# Patient Record
Sex: Female | Born: 1950 | Race: White | Hispanic: No | Marital: Single | Smoking: Current every day smoker
Health system: Southern US, Community
[De-identification: ages and names within clinical notes are randomized; demographics above are authoritative.]

## PROBLEM LIST (undated history)

## (undated) DIAGNOSIS — E785 Hyperlipidemia, unspecified: Secondary | ICD-10-CM

## (undated) DIAGNOSIS — T7840XA Allergy, unspecified, initial encounter: Secondary | ICD-10-CM

## (undated) DIAGNOSIS — Z5189 Encounter for other specified aftercare: Secondary | ICD-10-CM

## (undated) HISTORY — DX: Allergy, unspecified, initial encounter: T78.40XA

## (undated) HISTORY — PX: DENTAL SURGERY: SHX609

## (undated) HISTORY — DX: Encounter for other specified aftercare: Z51.89

## (undated) HISTORY — DX: Hyperlipidemia, unspecified: E78.5

---

## 1974-04-23 DIAGNOSIS — Z5189 Encounter for other specified aftercare: Secondary | ICD-10-CM

## 1974-04-23 HISTORY — DX: Encounter for other specified aftercare: Z51.89

## 1981-04-23 HISTORY — PX: TUBAL LIGATION: SHX77

## 2002-03-14 ENCOUNTER — Emergency Department (HOSPITAL_COMMUNITY): Admission: EM | Admit: 2002-03-14 | Discharge: 2002-03-15 | Payer: Self-pay | Admitting: Emergency Medicine

## 2013-06-29 ENCOUNTER — Ambulatory Visit: Payer: Self-pay | Admitting: Family Medicine

## 2013-10-13 ENCOUNTER — Encounter: Payer: Self-pay | Admitting: Internal Medicine

## 2013-11-26 ENCOUNTER — Encounter: Payer: Self-pay | Admitting: Obstetrics & Gynecology

## 2013-11-26 ENCOUNTER — Ambulatory Visit (INDEPENDENT_AMBULATORY_CARE_PROVIDER_SITE_OTHER): Payer: 59 | Admitting: Obstetrics & Gynecology

## 2013-11-26 VITALS — BP 157/75 | HR 82 | Temp 97.3°F | Ht 67.0 in | Wt 150.0 lb

## 2013-11-26 DIAGNOSIS — Z0000 Encounter for general adult medical examination without abnormal findings: Secondary | ICD-10-CM

## 2013-11-26 DIAGNOSIS — Z01419 Encounter for gynecological examination (general) (routine) without abnormal findings: Secondary | ICD-10-CM

## 2013-11-26 NOTE — Progress Notes (Signed)
Subjective:     Cynthia Gonzalez is a 63 y.o. female here for a routine exam.  Current complaints: none.    Personal health questionnaire:  Is patient Ashkenazi Jewish, have a family history of breast and/or ovarian cancer: no Is there a family history of uterine cancer diagnosed at age < 24, gastrointestinal cancer, urinary tract cancer, family member who is a Field seismologist syndrome-associated carrier: no Is the patient overweight and hypertensive, family history of diabetes, personal history of gestational diabetes or PCOS: no Is patient over 33, have PCOS,  family history of premature CHD under age 36, diabetes, smoke, have hypertension or peripheral artery disease:  yes At any time, has a partner hit, kicked or otherwise hurt or frightened you?: no Over the past 2 weeks, have you felt down, depressed or hopeless?: no Over the past 2 weeks, have you felt little interest or pleasure in doing things?:no   Gynecologic History No LMP recorded. Patient is postmenopausal. Contraception: post menopausal status Last Pap: 20 yrs ago. Results were: normal Last mammogram: 20 yrs ago. Results were: normal  Obstetric History OB History  Gravida Para Term Preterm AB SAB TAB Ectopic Multiple Living  6 6 6      1 7     # Outcome Date GA Lbr Len/2nd Weight Sex Delivery Anes PTL Lv  6A TRM 06/23/81 [redacted]w[redacted]d   F SVD   Y  6B  06/23/81 [redacted]w[redacted]d   M SVD   Y  5 TRM 11/30/79 [redacted]w[redacted]d   F SVD   Y  4 TRM 06/24/77 [redacted]w[redacted]d   F SVD   Y  3 TRM 10/21/74 [redacted]w[redacted]d   M SVD   Y  2 TRM 07/07/71 [redacted]w[redacted]d   F SVD   Y  1 TRM 06/04/66 [redacted]w[redacted]d   F SVD None  Y      History reviewed. No pertinent past medical history.  History reviewed. No pertinent past surgical history.  No current outpatient prescriptions on file. No Known Allergies  History  Substance Use Topics  . Smoking status: Current Every Day Smoker -- 0.50 packs/day for 40 years    Types: Cigarettes  . Smokeless tobacco: Never Used  . Alcohol Use: No    Family History   Problem Relation Age of Onset  . Cirrhosis Mother   . Cancer Father       Review of Systems  Constitutional: negative for fatigue and weight loss Respiratory: negative for cough and wheezing Cardiovascular: negative for chest pain, fatigue and palpitations Gastrointestinal: negative for abdominal pain and change in bowel habits Musculoskeletal:negative for myalgias Neurological: negative for gait problems and tremors Behavioral/Psych: negative for abusive relationship, depression Endocrine: negative for temperature intolerance   Genitourinary:negative for abnormal menstrual periods, genital lesions, hot flashes, sexual problems and vaginal discharge Integument/breast: negative for breast lump, breast tenderness, nipple discharge and skin lesion(s)    Objective:       BP 157/75  Pulse 82  Temp(Src) 97.3 F (36.3 C) (Oral)  Ht 5\' 7"  (1.702 m)  Wt 68.04 kg (150 lb)  BMI 23.49 kg/m2 General:   alert  Skin:   no rash or abnormalities  Lungs:   clear to auscultation bilaterally  Heart:   regular rate and rhythm, S1, S2 normal, no murmur, click, rub or gallop  Breasts:   normal without suspicious masses, skin or nipple changes or axillary nodes  Abdomen:  normal findings: no organomegaly, soft, non-tender and no hernia  Pelvis:  External genitalia: normal general appearance Urinary  system: urethral meatus normal and bladder without fullness, non tender; second degree cystocele with Valsalva Vaginal: normal without tenderness, induration or masses, rectocele Cervix: normal appearance Adnexa: normal bimanual exam Uterus: anteverted and non-tender, normal size   Lab Review  Labs reviewed no Radiologic studies reviewed no    Assessment:    Healthy female exam.  Lapse in routine gynecologic preventive care POP--asymptomatic   Plan:    Education reviewed: calcium supplements, low fat, low cholesterol diet, smoking cessation, weight bearing exercise and counseled re:  Kegel's exercises. Mammogram ordered. BMD ordered    Orders Placed This Encounter  Procedures  . MM DIGITAL SCREENING BILATERAL    Standing Status: Future     Number of Occurrences:      Standing Expiration Date: 01/27/2015    Order Specific Question:  Reason for Exam (SYMPTOM  OR DIAGNOSIS REQUIRED)    Answer:  routine screening    Order Specific Question:  Preferred imaging location?    Answer:  [facility]  . DG Bone Density    Standing Status: Future     Number of Occurrences:      Standing Expiration Date: 01/27/2015    Order Specific Question:  Reason for Exam (SYMPTOM  OR DIAGNOSIS REQUIRED)    Answer:  routine screening , menopausal female    Order Specific Question:  Preferred imaging location?    Answer:  [facility]    Follow up as needed.

## 2013-11-26 NOTE — Patient Instructions (Signed)
Kegel Exercises The goal of Kegel exercises is to isolate and exercise your pelvic floor muscles. These muscles act as a hammock that supports the rectum, vagina, small intestine, and uterus. As the muscles weaken, the hammock sags and these organs are displaced from their normal positions. Kegel exercises can strengthen your pelvic floor muscles and help you to improve bladder and bowel control, improve sexual response, and help reduce many problems and some discomfort during pregnancy. Kegel exercises can be done anywhere and at any time. HOW TO PERFORM KEGEL EXERCISES 1. Locate your pelvic floor muscles. To do this, squeeze (contract) the muscles that you use when you try to stop the flow of urine. You will feel a tightness in the vaginal area (women) and a tight lift in the rectal area (men and women). 2. When you begin, contract your pelvic muscles tight for 2-5 seconds, then relax them for 2-5 seconds. This is one set. Do 4-5 sets with a short pause in between. 3. Contract your pelvic muscles for 8-10 seconds, then relax them for 8-10 seconds. Do 4-5 sets. If you cannot contract your pelvic muscles for 8-10 seconds, try 5-7 seconds and work your way up to 8-10 seconds. Your goal is 4-5 sets of 10 contractions each day. Keep your stomach, buttocks, and legs relaxed during the exercises. Perform sets of both short and long contractions. Vary your positions. Perform these contractions 3-4 times per day. Perform sets while you are:   Lying in bed in the morning.  Standing at lunch.  Sitting in the late afternoon.  Lying in bed at night. You should do 40-50 contractions per day. Do not perform more Kegel exercises per day than recommended. Overexercising can cause muscle fatigue. Continue these exercises for for at least 15-20 weeks or as directed by your caregiver. Document Released: 03/26/2012 Document Reviewed: 03/26/2012 [facility] Patient Information 2015 [place]. This information is  not intended to replace advice given to you by your health care provider. Make sure you discuss any questions you have with your health care provider. Kegel Exercises The goal of Kegel exercises is to isolate and exercise your pelvic floor muscles. These muscles act as a hammock that supports the rectum, vagina, small intestine, and uterus. As the muscles weaken, the hammock sags and these organs are displaced from their normal positions. Kegel exercises can strengthen your pelvic floor muscles and help you to improve bladder and bowel control, improve sexual response, and help reduce many problems and some discomfort during pregnancy. Kegel exercises can be done anywhere and at any time. HOW TO PERFORM KEGEL EXERCISES 4. Locate your pelvic floor muscles. To do this, squeeze (contract) the muscles that you use when you try to stop the flow of urine. You will feel a tightness in the vaginal area (women) and a tight lift in the rectal area (men and women). 5. When you begin, contract your pelvic muscles tight for 2-5 seconds, then relax them for 2-5 seconds. This is one set. Do 4-5 sets with a short pause in between. 6. Contract your pelvic muscles for 8-10 seconds, then relax them for 8-10 seconds. Do 4-5 sets. If you cannot contract your pelvic muscles for 8-10 seconds, try 5-7 seconds and work your way up to 8-10 seconds. Your goal is 4-5 sets of 10 contractions each day. Keep your stomach, buttocks, and legs relaxed during the exercises. Perform sets of both short and long contractions. Vary your positions. Perform these contractions 3-4 times per day. Perform sets  while you are:   Lying in bed in the morning.  Standing at lunch.  Sitting in the late afternoon.  Lying in bed at night. You should do 40-50 contractions per day. Do not perform more Kegel exercises per day than recommended. Overexercising can cause muscle fatigue. Continue these exercises for for at least 15-20 weeks or as directed  by your caregiver. Document Released: 03/26/2012 Document Reviewed: 03/26/2012 [facility] Patient Information 2015 [place]. This information is not intended to replace advice given to you by your health care provider. Make sure you discuss any questions you have with your health care provider. Kegel Exercises The goal of Kegel exercises is to isolate and exercise your pelvic floor muscles. These muscles act as a hammock that supports the rectum, vagina, small intestine, and uterus. As the muscles weaken, the hammock sags and these organs are displaced from their normal positions. Kegel exercises can strengthen your pelvic floor muscles and help you to improve bladder and bowel control, improve sexual response, and help reduce many problems and some discomfort during pregnancy. Kegel exercises can be done anywhere and at any time. HOW TO PERFORM KEGEL EXERCISES 7. Locate your pelvic floor muscles. To do this, squeeze (contract) the muscles that you use when you try to stop the flow of urine. You will feel a tightness in the vaginal area (women) and a tight lift in the rectal area (men and women). 8. When you begin, contract your pelvic muscles tight for 2-5 seconds, then relax them for 2-5 seconds. This is one set. Do 4-5 sets with a short pause in between. 9. Contract your pelvic muscles for 8-10 seconds, then relax them for 8-10 seconds. Do 4-5 sets. If you cannot contract your pelvic muscles for 8-10 seconds, try 5-7 seconds and work your way up to 8-10 seconds. Your goal is 4-5 sets of 10 contractions each day. Keep your stomach, buttocks, and legs relaxed during the exercises. Perform sets of both short and long contractions. Vary your positions. Perform these contractions 3-4 times per day. Perform sets while you are:   Lying in bed in the morning.  Standing at lunch.  Sitting in the late afternoon.  Lying in bed at night. You should do 40-50 contractions per day. Do not perform more  Kegel exercises per day than recommended. Overexercising can cause muscle fatigue. Continue these exercises for for at least 15-20 weeks or as directed by your caregiver. Document Released: 03/26/2012 Document Reviewed: 03/26/2012 [facility] Patient Information 2015 [place]. This information is not intended to replace advice given to you by your health care provider. Make sure you discuss any questions you have with your health care provider. Bone Health Our bones do many things. They provide structure, protect organs, anchor muscles, and store calcium. Adequate calcium in your diet and weight-bearing physical activity help build strong bones, improve bone amounts, and may reduce the risk of weakening of bones (osteoporosis) later in life. PEAK BONE MASS By age 64, the average woman has acquired most of her skeletal bone mass. A large decline occurs in older adults which increases the risk of osteoporosis. In women this occurs around the time of menopause. It is important for young girls to reach their peak bone mass in order to maintain bone health throughout life. A person with high bone mass as a young adult will be more likely to have a higher bone mass later in life. Not enough calcium consumption and physical activity early on could result in a  failure to achieve optimum bone mass in adulthood. OSTEOPOROSIS Osteoporosis is a disease of the bones. It is defined as low bone mass with deterioration of bone structure. Osteoporosis leads to an increase risk of fractures with falls. These fractures commonly happen in the wrist, hip, and spine. While men and women of all ages and background can develop osteoporosis, some of the risk factors for osteoporosis are:  Female.  White.  Postmenopausal.  Older adults.  Small in body size.  Eating a diet low in calcium.  Physically inactive.  Smoking.  Use of some medications.  Family history. CALCIUM Calcium is a mineral needed by the  body for healthy bones, teeth, and proper function of the heart, muscles, and nerves. The body cannot produce calcium so it must be absorbed through food. Good sources of calcium include:  Dairy products (low fat or nonfat milk, cheese, and yogurt).  Dark green leafy vegetables (bok choy and broccoli).  Calcium fortified foods (orange juice, cereal, bread, soy beverages, and tofu products).  Nuts (almonds). Recommended amounts of calcium vary for individuals. RECOMMENDED CALCIUM INTAKES Age and Amount in mg per day  Children 1 to 3 years / 700 mg  Children 4 to 8 years / 1,000 mg  Children 9 to 13 years / 1,300 mg  Teens 14 to 18 years / 1,300 mg  Adults 19 to 50 years / 1,000 mg  Adult women 51 to 70 years / 1,200 mg  Adults 71 years and older / 1,200 mg  Pregnant and breastfeeding teens / 1,300 mg  Pregnant and breastfeeding adults / 1,000 mg Vitamin D also plays an important role in healthy bone development. Vitamin D helps in the absorption of calcium. WEIGHT-BEARING PHYSICAL ACTIVITY Regular physical activity has many positive health benefits. Benefits include strong bones. Weight-bearing physical activity early in life is important in reaching peak bone mass. Weight-bearing physical activities cause muscles and bones to work against gravity. Some examples of weight bearing physical activities include:  Walking, jogging, or running.  [employer].  Jumping rope.  Dancing.  Soccer.  Tennis or Racquetball.  Stair climbing.  Basketball.  Hiking.  Weight lifting.  Aerobic fitness classes. Including weight-bearing physical activity into an exercise plan is a great way to keep bones healthy. Adults: Engage in at least 30 minutes of moderate physical activity on most, preferably all, days of the week. Children: Engage in at least 60 minutes of moderate physical activity on most, preferably all, days of the week. FOR MORE INFORMATION [place] Higher education careers adviser, Soil scientist for [employer] and Promotion: www.cnpp.usda.[place]: EquipmentWeekly.com.ee Document Released: 06/30/2003 Document Revised: 08/04/2012 Document Reviewed: 09/29/2008 [facility] Patient Information 2015 [place], [place]. This information is not intended to replace advice given to you by your health care provider. Make sure you discuss any questions you have with your health care provider.

## 2013-11-27 LAB — PAP IG AND HPV HIGH-RISK: HPV DNA HIGH RISK: NOT DETECTED

## 2013-11-28 ENCOUNTER — Encounter: Payer: Self-pay | Admitting: Obstetrics & Gynecology

## 2013-11-28 DIAGNOSIS — R87610 Atypical squamous cells of undetermined significance on cytologic smear of cervix (ASC-US): Secondary | ICD-10-CM | POA: Insufficient documentation

## 2013-11-30 ENCOUNTER — Other Ambulatory Visit: Payer: Self-pay | Admitting: Obstetrics & Gynecology

## 2013-11-30 DIAGNOSIS — E2839 Other primary ovarian failure: Secondary | ICD-10-CM

## 2013-11-30 DIAGNOSIS — Z780 Asymptomatic menopausal state: Secondary | ICD-10-CM

## 2013-11-30 DIAGNOSIS — Z1231 Encounter for screening mammogram for malignant neoplasm of breast: Secondary | ICD-10-CM

## 2013-12-22 ENCOUNTER — Encounter: Payer: Self-pay | Admitting: Internal Medicine

## 2014-01-06 ENCOUNTER — Ambulatory Visit: Payer: Self-pay

## 2014-01-06 ENCOUNTER — Inpatient Hospital Stay: Admission: RE | Admit: 2014-01-06 | Payer: Self-pay | Source: Ambulatory Visit

## 2014-01-20 ENCOUNTER — Ambulatory Visit
Admission: RE | Admit: 2014-01-20 | Discharge: 2014-01-20 | Disposition: A | Discharge status: Home / Self Care | Payer: Medicare Other | Source: Ambulatory Visit | Attending: Obstetrics & Gynecology | Admitting: Obstetrics & Gynecology

## 2014-01-20 DIAGNOSIS — Z1231 Encounter for screening mammogram for malignant neoplasm of breast: Secondary | ICD-10-CM

## 2014-01-20 DIAGNOSIS — E2839 Other primary ovarian failure: Secondary | ICD-10-CM

## 2014-01-20 DIAGNOSIS — Z780 Asymptomatic menopausal state: Secondary | ICD-10-CM

## 2014-01-21 ENCOUNTER — Other Ambulatory Visit: Payer: Self-pay | Admitting: Obstetrics & Gynecology

## 2014-01-21 DIAGNOSIS — R928 Other abnormal and inconclusive findings on diagnostic imaging of breast: Secondary | ICD-10-CM

## 2014-01-29 ENCOUNTER — Ambulatory Visit
Admission: RE | Admit: 2014-01-29 | Discharge: 2014-01-29 | Disposition: A | Discharge status: Home / Self Care | Payer: Medicare Other | Source: Ambulatory Visit | Attending: Obstetrics & Gynecology | Admitting: Obstetrics & Gynecology

## 2014-01-29 DIAGNOSIS — R928 Other abnormal and inconclusive findings on diagnostic imaging of breast: Secondary | ICD-10-CM

## 2014-02-01 ENCOUNTER — Encounter: Payer: Self-pay | Admitting: Obstetrics & Gynecology

## 2014-02-01 ENCOUNTER — Telehealth: Payer: Self-pay | Admitting: *Deleted

## 2014-02-01 DIAGNOSIS — M8580 Other specified disorders of bone density and structure, unspecified site: Secondary | ICD-10-CM | POA: Insufficient documentation

## 2014-02-01 NOTE — Telephone Encounter (Signed)
Patient notified of the Osteopenia and the increase risk for Fracture of spine and hip. Patient notified to continue taking the calcium and vitamin d to help with that and that Dr. Delsa Sale recommends that she repeat this test in 1-2 years. Patient voiced understanding.

## 2014-02-22 ENCOUNTER — Encounter: Payer: Self-pay | Admitting: Obstetrics & Gynecology

## 2014-04-19 ENCOUNTER — Encounter: Payer: Self-pay | Admitting: *Deleted

## 2014-04-20 ENCOUNTER — Encounter: Payer: Self-pay | Admitting: Obstetrics & Gynecology

## 2014-06-15 DIAGNOSIS — H35413 Lattice degeneration of retina, bilateral: Secondary | ICD-10-CM | POA: Diagnosis not present

## 2014-06-15 DIAGNOSIS — H43813 Vitreous degeneration, bilateral: Secondary | ICD-10-CM | POA: Diagnosis not present

## 2014-06-15 DIAGNOSIS — Z961 Presence of intraocular lens: Secondary | ICD-10-CM | POA: Diagnosis not present

## 2014-06-15 DIAGNOSIS — H26491 Other secondary cataract, right eye: Secondary | ICD-10-CM | POA: Diagnosis not present

## 2014-07-06 ENCOUNTER — Encounter: Payer: Self-pay | Admitting: Internal Medicine

## 2014-07-06 DIAGNOSIS — E784 Other hyperlipidemia: Secondary | ICD-10-CM | POA: Diagnosis not present

## 2014-07-06 DIAGNOSIS — F17210 Nicotine dependence, cigarettes, uncomplicated: Secondary | ICD-10-CM | POA: Diagnosis not present

## 2014-07-06 DIAGNOSIS — Z23 Encounter for immunization: Secondary | ICD-10-CM | POA: Diagnosis not present

## 2014-07-09 ENCOUNTER — Other Ambulatory Visit: Payer: Self-pay | Admitting: Obstetrics

## 2014-07-09 DIAGNOSIS — N6320 Unspecified lump in the left breast, unspecified quadrant: Secondary | ICD-10-CM

## 2014-07-19 ENCOUNTER — Encounter: Payer: Medicaid Other | Admitting: Internal Medicine

## 2014-08-02 ENCOUNTER — Ambulatory Visit
Admission: RE | Admit: 2014-08-02 | Discharge: 2014-08-02 | Disposition: A | Discharge status: Home / Self Care | Payer: Medicaid Other | Source: Ambulatory Visit | Attending: Obstetrics | Admitting: Obstetrics

## 2014-08-02 DIAGNOSIS — N6320 Unspecified lump in the left breast, unspecified quadrant: Secondary | ICD-10-CM

## 2014-08-02 DIAGNOSIS — N63 Unspecified lump in breast: Secondary | ICD-10-CM | POA: Diagnosis not present

## 2014-08-11 ENCOUNTER — Encounter: Payer: Self-pay | Admitting: Internal Medicine

## 2014-08-20 ENCOUNTER — Ambulatory Visit (AMBULATORY_SURGERY_CENTER): Payer: Self-pay | Admitting: *Deleted

## 2014-08-20 VITALS — Ht 66.0 in | Wt 150.4 lb

## 2014-08-20 DIAGNOSIS — Z1211 Encounter for screening for malignant neoplasm of colon: Secondary | ICD-10-CM

## 2014-08-20 MED ORDER — NA SULFATE-K SULFATE-MG SULF 17.5-3.13-1.6 GM/177ML PO SOLN: 1.0000 | Freq: Once | ORAL | Status: DC

## 2014-08-20 NOTE — Progress Notes (Signed)
No egg or soy allergy No diet pills No home 02 No issues with past sedation  emmi declined

## 2014-08-26 ENCOUNTER — Encounter: Payer: Self-pay | Admitting: Internal Medicine

## 2014-09-03 ENCOUNTER — Encounter: Payer: Medicaid Other | Admitting: Internal Medicine

## 2014-09-09 ENCOUNTER — Telehealth: Payer: Self-pay | Admitting: Internal Medicine

## 2014-09-09 NOTE — Telephone Encounter (Signed)
No charge. 

## 2014-09-13 ENCOUNTER — Encounter: Payer: Medicaid Other | Admitting: Internal Medicine

## 2014-12-06 ENCOUNTER — Ambulatory Visit: Payer: 59 | Admitting: Obstetrics & Gynecology

## 2021-04-26 ENCOUNTER — Inpatient Hospital Stay (HOSPITAL_BASED_OUTPATIENT_CLINIC_OR_DEPARTMENT_OTHER)
Admission: EM | Admit: 2021-04-26 | Discharge: 2021-04-28 | DRG: 166 | Disposition: A | Discharge status: Home / Self Care | Payer: Medicare Other | Attending: Internal Medicine | Admitting: Internal Medicine

## 2021-04-26 ENCOUNTER — Encounter (HOSPITAL_BASED_OUTPATIENT_CLINIC_OR_DEPARTMENT_OTHER): Payer: Self-pay

## 2021-04-26 ENCOUNTER — Emergency Department (HOSPITAL_BASED_OUTPATIENT_CLINIC_OR_DEPARTMENT_OTHER): Payer: Medicare Other

## 2021-04-26 ENCOUNTER — Other Ambulatory Visit: Payer: Self-pay

## 2021-04-26 DIAGNOSIS — D72819 Decreased white blood cell count, unspecified: Secondary | ICD-10-CM | POA: Diagnosis present

## 2021-04-26 DIAGNOSIS — Z23 Encounter for immunization: Secondary | ICD-10-CM

## 2021-04-26 DIAGNOSIS — F17210 Nicotine dependence, cigarettes, uncomplicated: Secondary | ICD-10-CM | POA: Diagnosis present

## 2021-04-26 DIAGNOSIS — Z9889 Other specified postprocedural states: Secondary | ICD-10-CM

## 2021-04-26 DIAGNOSIS — R918 Other nonspecific abnormal finding of lung field: Secondary | ICD-10-CM | POA: Diagnosis not present

## 2021-04-26 DIAGNOSIS — I7143 Infrarenal abdominal aortic aneurysm, without rupture: Secondary | ICD-10-CM | POA: Diagnosis present

## 2021-04-26 DIAGNOSIS — Z20822 Contact with and (suspected) exposure to covid-19: Secondary | ICD-10-CM | POA: Diagnosis present

## 2021-04-26 DIAGNOSIS — G8321 Monoplegia of upper limb affecting right dominant side: Secondary | ICD-10-CM | POA: Diagnosis present

## 2021-04-26 DIAGNOSIS — M7989 Other specified soft tissue disorders: Secondary | ICD-10-CM | POA: Diagnosis present

## 2021-04-26 DIAGNOSIS — D75839 Thrombocytosis, unspecified: Secondary | ICD-10-CM | POA: Diagnosis present

## 2021-04-26 DIAGNOSIS — C7931 Secondary malignant neoplasm of brain: Secondary | ICD-10-CM | POA: Diagnosis present

## 2021-04-26 DIAGNOSIS — G9389 Other specified disorders of brain: Secondary | ICD-10-CM

## 2021-04-26 DIAGNOSIS — Z808 Family history of malignant neoplasm of other organs or systems: Secondary | ICD-10-CM | POA: Diagnosis not present

## 2021-04-26 DIAGNOSIS — E785 Hyperlipidemia, unspecified: Secondary | ICD-10-CM | POA: Diagnosis present

## 2021-04-26 DIAGNOSIS — C3412 Malignant neoplasm of upper lobe, left bronchus or lung: Secondary | ICD-10-CM | POA: Diagnosis present

## 2021-04-26 DIAGNOSIS — I739 Peripheral vascular disease, unspecified: Secondary | ICD-10-CM | POA: Diagnosis present

## 2021-04-26 DIAGNOSIS — G936 Cerebral edema: Secondary | ICD-10-CM | POA: Diagnosis present

## 2021-04-26 DIAGNOSIS — R900 Intracranial space-occupying lesion found on diagnostic imaging of central nervous system: Secondary | ICD-10-CM

## 2021-04-26 DIAGNOSIS — C771 Secondary and unspecified malignant neoplasm of intrathoracic lymph nodes: Secondary | ICD-10-CM | POA: Diagnosis present

## 2021-04-26 DIAGNOSIS — Z419 Encounter for procedure for purposes other than remedying health state, unspecified: Secondary | ICD-10-CM

## 2021-04-26 DIAGNOSIS — C770 Secondary and unspecified malignant neoplasm of lymph nodes of head, face and neck: Secondary | ICD-10-CM | POA: Diagnosis present

## 2021-04-26 DIAGNOSIS — Z79899 Other long term (current) drug therapy: Secondary | ICD-10-CM

## 2021-04-26 LAB — COMPREHENSIVE METABOLIC PANEL
ALT: 7 U/L (ref 0–44)
AST: 12 U/L — ABNORMAL LOW (ref 15–41)
Albumin: 3.7 g/dL (ref 3.5–5.0)
Alkaline Phosphatase: 102 U/L (ref 38–126)
Anion gap: 10 (ref 5–15)
BUN: 10 mg/dL (ref 8–23)
CO2: 24 mmol/L (ref 22–32)
Calcium: 9 mg/dL (ref 8.9–10.3)
Chloride: 101 mmol/L (ref 98–111)
Creatinine, Ser: 0.79 mg/dL (ref 0.44–1.00)
GFR, Estimated: >60 mL/min (ref >60)
Glucose, Bld: 78 mg/dL (ref 70–99)
Potassium: 3.8 mmol/L (ref 3.5–5.1)
Sodium: 135 mmol/L (ref 135–145)
Total Bilirubin: 0.3 mg/dL (ref 0.3–1.2)
Total Protein: 7.6 g/dL (ref 6.5–8.1)

## 2021-04-26 LAB — DIFFERENTIAL
Abs Immature Granulocytes: 0.02 10*3/uL (ref 0.00–0.07)
Basophils Absolute: 0.1 10*3/uL (ref 0.0–0.1)
Basophils Relative: 1 %
Eosinophils Absolute: 0.2 10*3/uL (ref 0.0–0.5)
Eosinophils Relative: 2 %
Immature Granulocytes: 0 %
Lymphocytes Relative: 21 %
Lymphs Abs: 1.4 10*3/uL (ref 0.7–4.0)
Monocytes Absolute: 0.8 10*3/uL (ref 0.1–1.0)
Monocytes Relative: 11 %
Neutro Abs: 4.4 10*3/uL (ref 1.7–7.7)
Neutrophils Relative %: 65 %

## 2021-04-26 LAB — CBC
HCT: 41.6 % (ref 36.0–46.0)
Hemoglobin: 14 g/dL (ref 12.0–15.0)
MCH: 29.7 pg (ref 26.0–34.0)
MCHC: 33.7 g/dL (ref 30.0–36.0)
MCV: 88.1 fL (ref 80.0–100.0)
Platelets: 461 10*3/uL — ABNORMAL HIGH (ref 150–400)
RBC: 4.72 MIL/uL (ref 3.87–5.11)
RDW: 12.7 % (ref 11.5–15.5)
WBC: 6.9 10*3/uL (ref 4.0–10.5)
nRBC: 0 % (ref 0.0–0.2)

## 2021-04-26 LAB — PROTIME-INR
INR: 0.9 (ref 0.8–1.2)
Prothrombin Time: 12.6 seconds (ref 11.4–15.2)

## 2021-04-26 LAB — APTT: aPTT: 29 seconds (ref 24–36)

## 2021-04-26 LAB — RESP PANEL BY RT-PCR (FLU A&B, COVID) ARPGX2
Influenza A by PCR: NEGATIVE
Influenza B by PCR: NEGATIVE
SARS Coronavirus 2 by RT PCR: NEGATIVE

## 2021-04-26 LAB — ETHANOL: Alcohol, Ethyl (B): <10 mg/dL (ref <10)

## 2021-04-26 IMAGING — CT CT NECK W/ CM
4 of 5 series · 15 of 33 positions shown, 17 images · IV contrast (omnipaque)
Comparison: No prior neck CT, correlation is made with [DATE]
CT head

CLINICAL DATA: Right parapharyngeal mass noted on head CT

EXAM:
CT NECK WITH CONTRAST
TECHNIQUE: Multidetector CT imaging of the neck was performed using the
standard protocol following the bolus administration of intravenous
contrast.
CONTRAST:  100mL OMNIPAQUE IOHEXOL 300 MG/ML  SOLN

[Series 3: axial neck · axial · 0.60mm/px · z∈[+1188,+1334]mm · 4 of 114 slices shown, 5 images]
[im 23/114  soft-tissue]
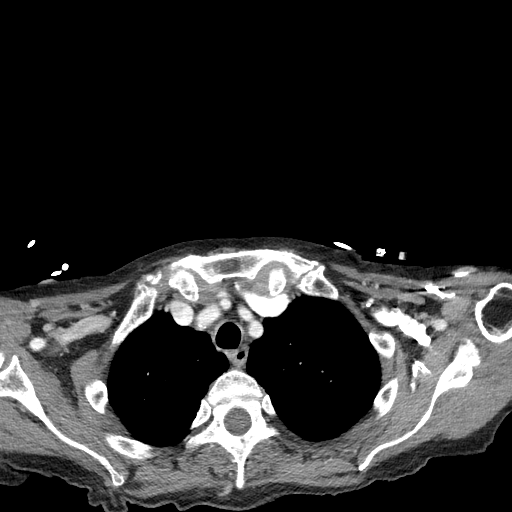
[im 23/114  bone]
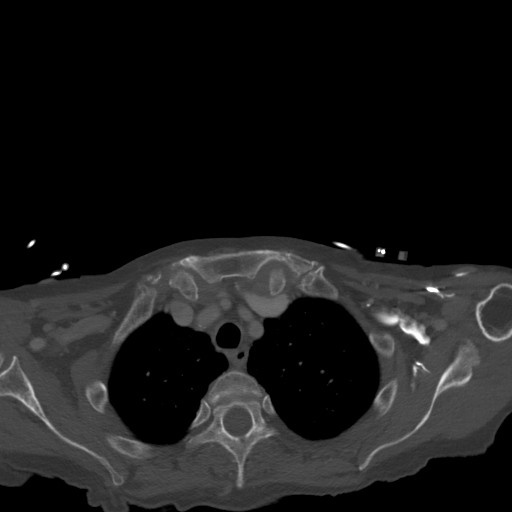
[im 46/114  bone]
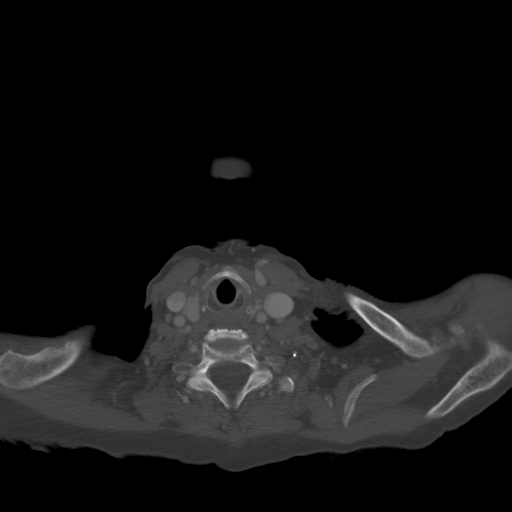
[im 68/114  bone]
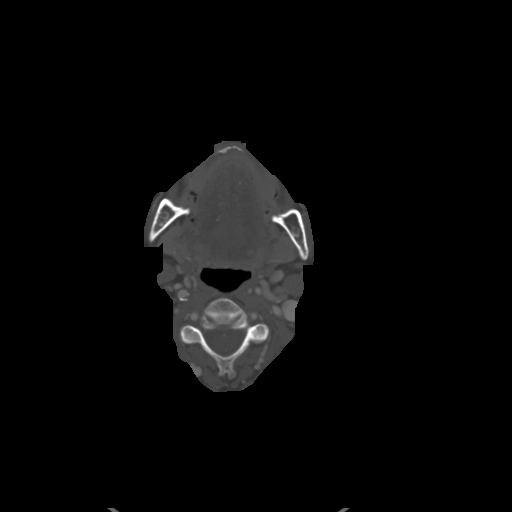
[im 91/114  bone]
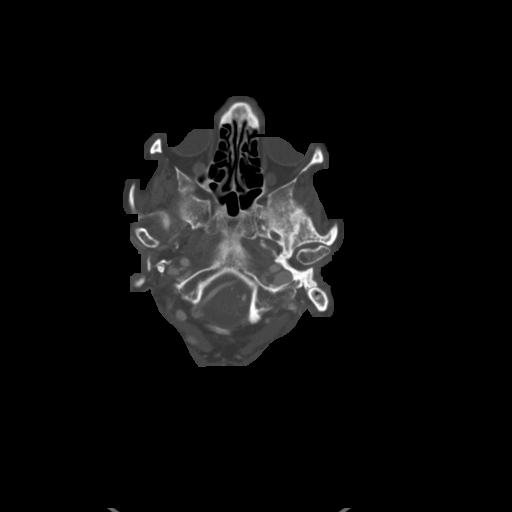

[Series 6: sag neck · sagittal · 0.56mm/px · 5 of 101 slices shown, 6 images]
[im 34/101  bone]
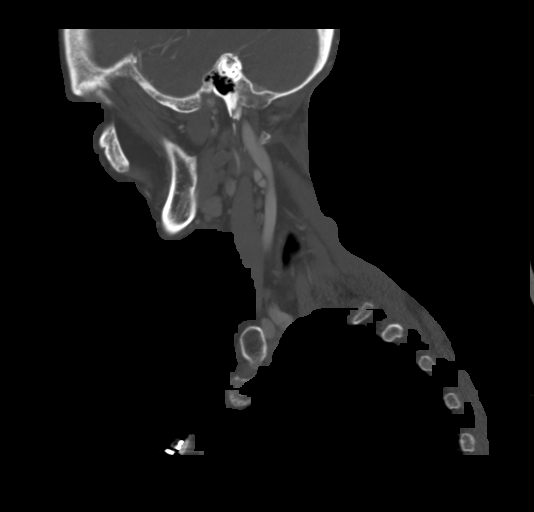
[im 42/101  bone]
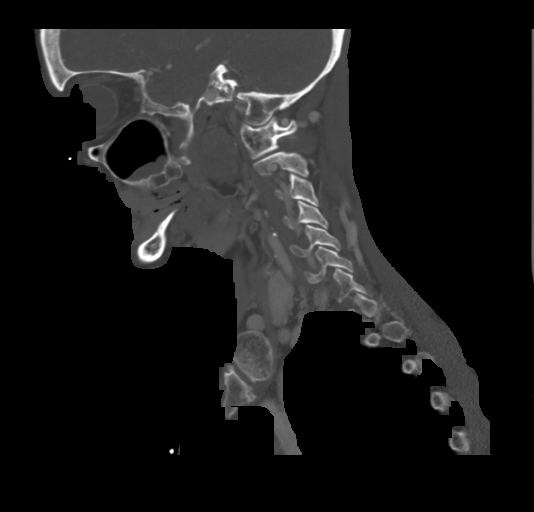
[im 51/101  soft-tissue]
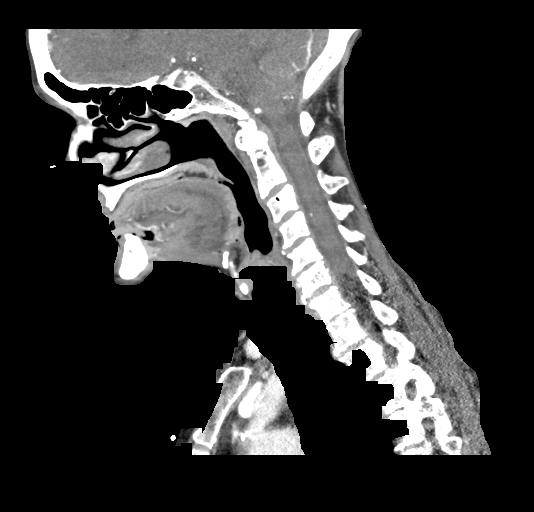
[im 51/101  bone]
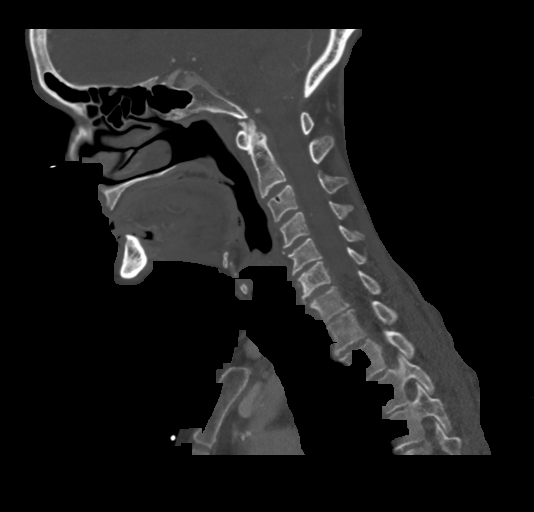
[im 59/101  bone]
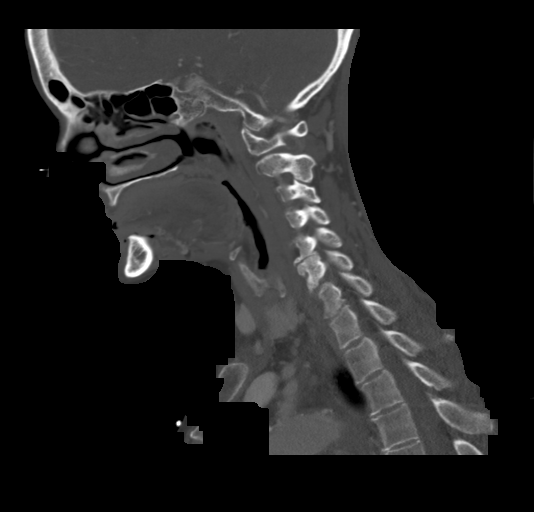
[im 67/101  bone]
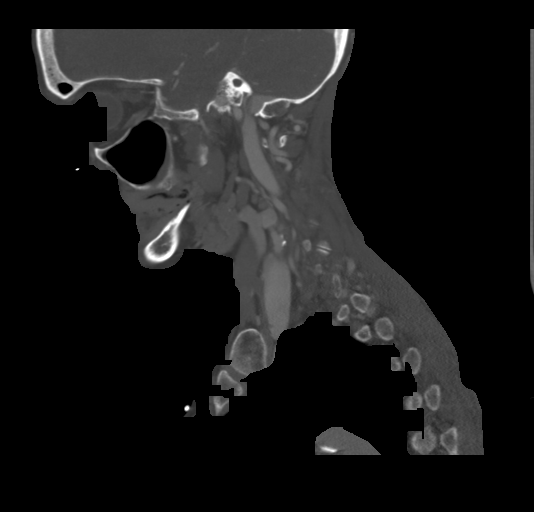

[Series 7: cor neck · coronal · 0.39mm/px · 3 of 105 slices shown]
[im 23/105  bone]
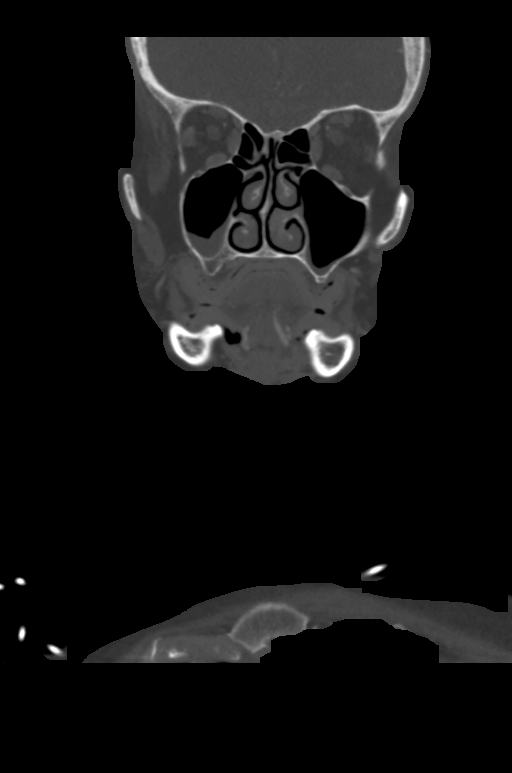
[im 43/105  bone]
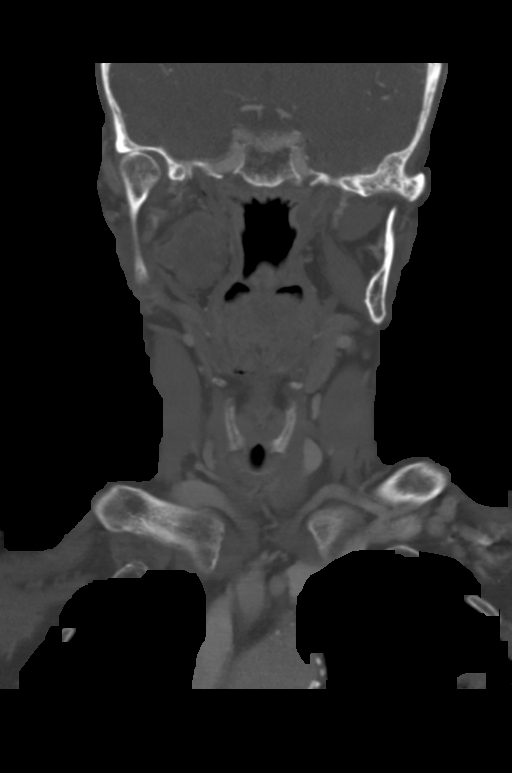
[im 63/105  bone]
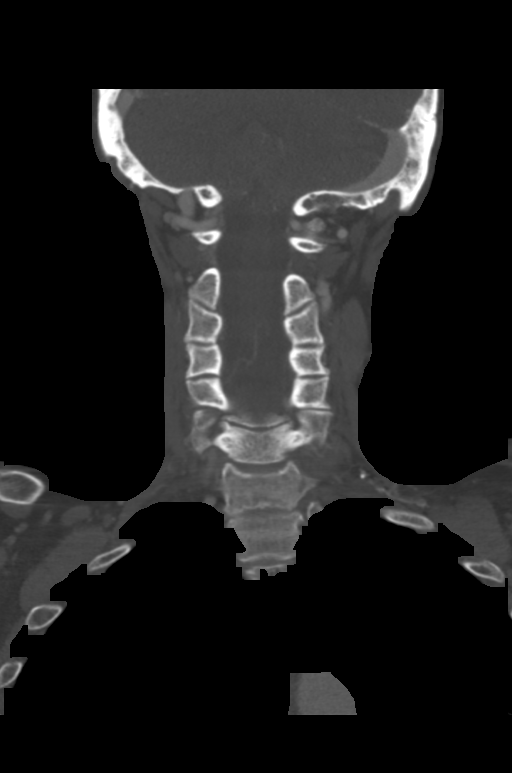

[Series 8: orthogonal ax · axial · 0.39mm/px · z∈[+1185,+1279]mm · 3 of 115 slices shown]
[im 23/115  bone]
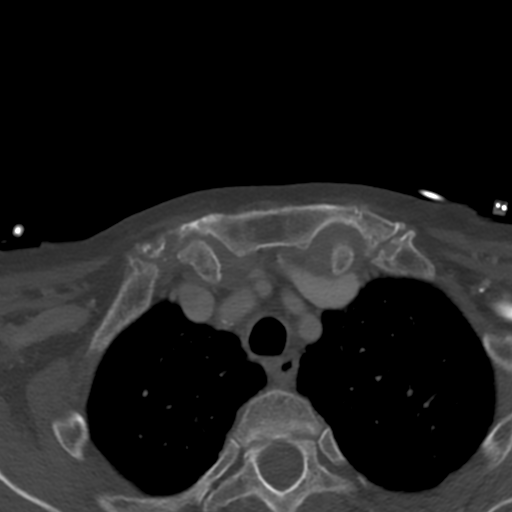
[im 46/115  bone]
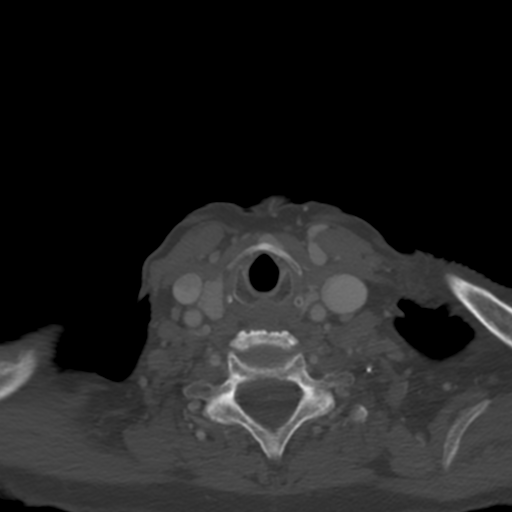
[im 69/115  bone]
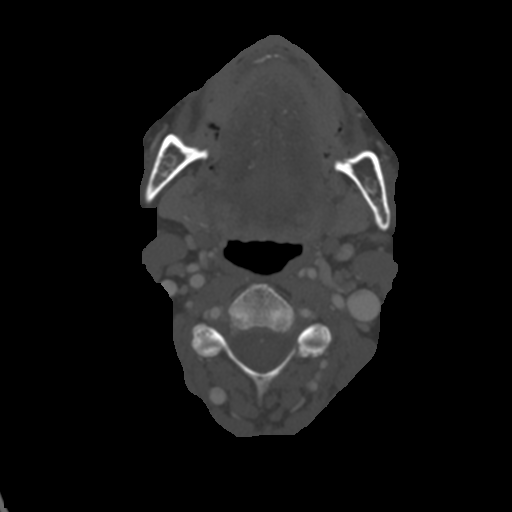

[15 of 33 positions shown; findings below may reference images not displayed]

FINDINGS: Pharynx and larynx: Normal. No mass or swelling.

Salivary glands: No inflammation, mass, or stone.

Thyroid: Subcentimeter lesion in the left thyroid lobe (series 3,
image 80). Otherwise negative.

Lymph nodes: Heterogeneously enhancing, centrally hypodense mass in
the right parapharyngeal space, favored to represent an enlarged
right level 2A lymph node, measuring up to 2.2 x 2.8 x 3.1 cm (AP x
TR x CC) (series 3, image 37 and series 7, image 63). No other
enlarged or abnormal appearing lymph nodes.

Vascular: Negative.

Limited intracranial: Negative. Previously noted left frontal lobe
mass is not included in the field of view.

Visualized orbits: Status post bilateral lens replacements.
Otherwise negative.

Mastoids and visualized paranasal sinuses: Mucosal thickening in the
bilateral maxillary sinuses. Otherwise negative.

Skeleton: Degenerative changes in the cervical spine. No acute
osseous abnormality.

Upper chest: Please see same-day CT chest

Other: None.
IMPRESSION: 1. Heterogeneously enhancing, centrally hypodense mass in the right
parapharyngeal space, favored to represent an enlarged, necrotic
right level 2A lymph node, concerning for metastatic disease. Biopsy
is recommended.
2. No other acute finding in the neck.

## 2021-04-26 IMAGING — CT CT CHEST-ABD-PELV W/ CM
2 of 5 series · 12 of 36 positions shown, 14 images · IV contrast (omnipaque)
Comparison: None.

CLINICAL DATA: Right parapharyngeal mass, left temporal lobe mass,
concern for metastatic disease

EXAM:
CT CHEST, ABDOMEN, AND PELVIS WITH CONTRAST
TECHNIQUE: Multidetector CT imaging of the chest, abdomen and pelvis was
performed following the standard protocol during bolus
administration of intravenous contrast.
CONTRAST:  100mL OMNIPAQUE IOHEXOL 300 MG/ML  SOLN

[Series 3: cap with 2 · axial · 0.76mm/px · z∈[+676,+1196]mm · 9 of 131 slices shown, 11 images]
[im 14/131  mediastinal]
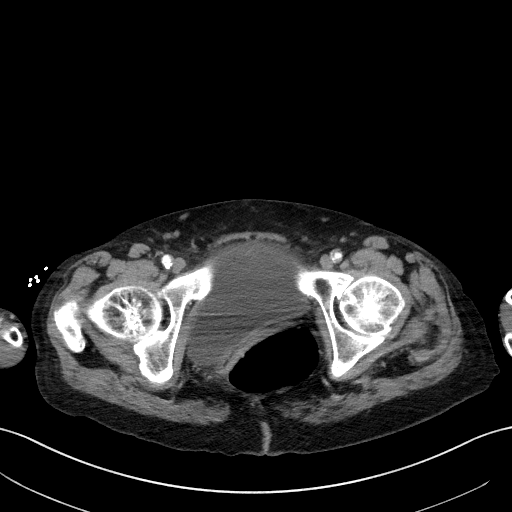
[im 14/131  bone]
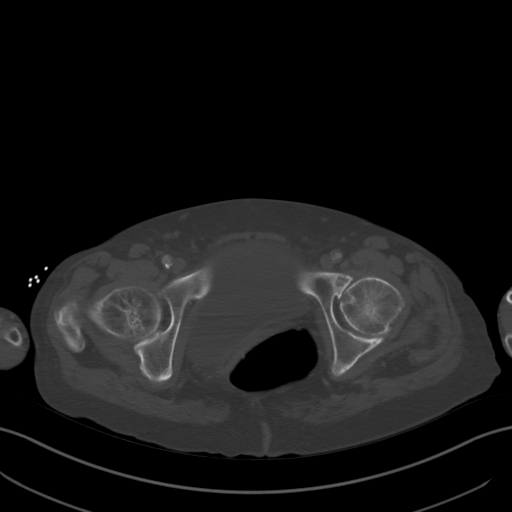
[im 27/131  mediastinal]
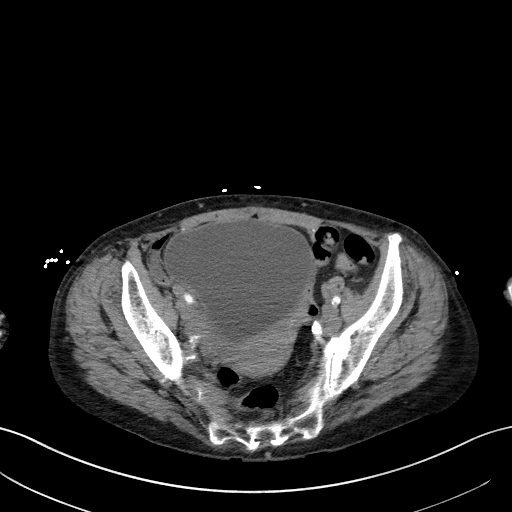
[im 40/131  mediastinal]
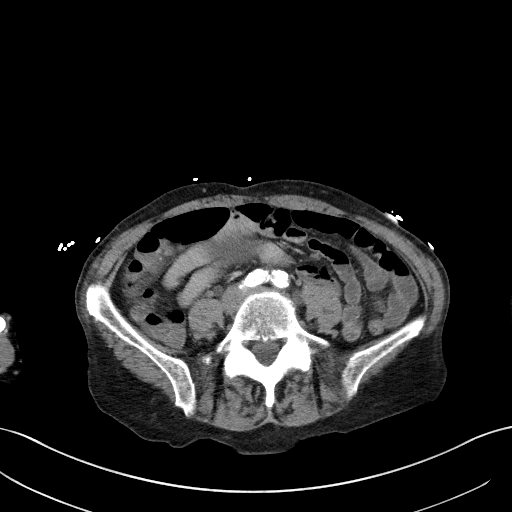
[im 53/131  mediastinal]
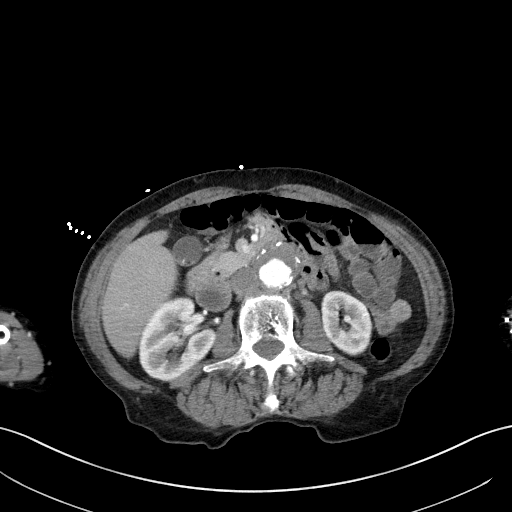
[im 66/131  mediastinal]
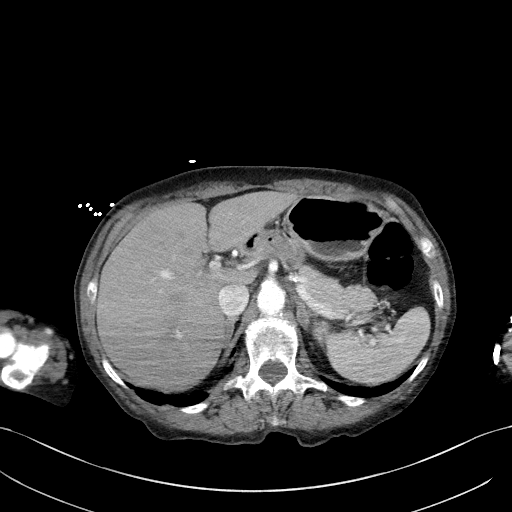
[im 79/131  mediastinal]
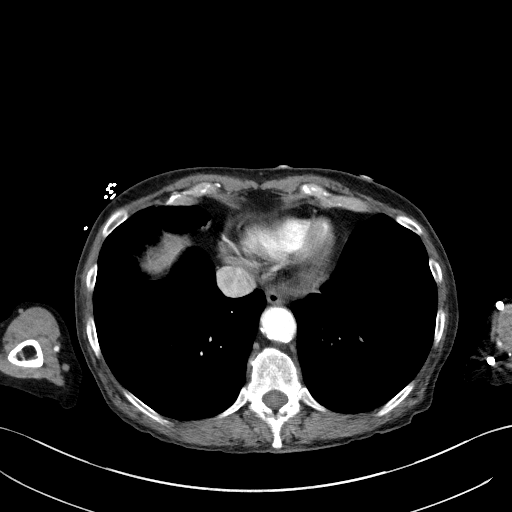
[im 92/131  mediastinal]
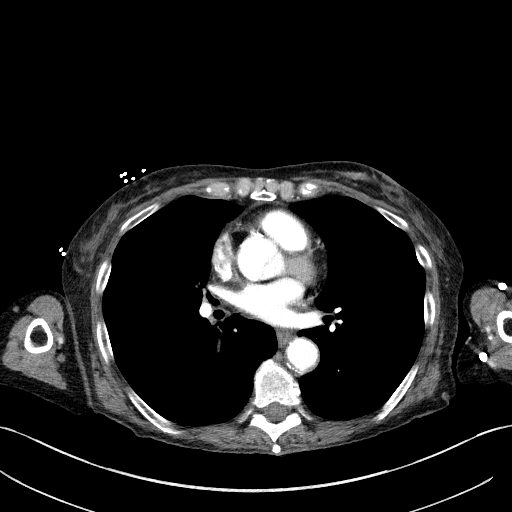
[im 105/131  mediastinal]
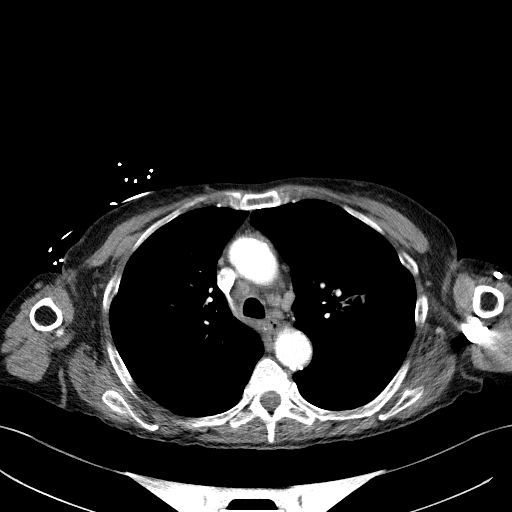
[im 118/131  mediastinal]
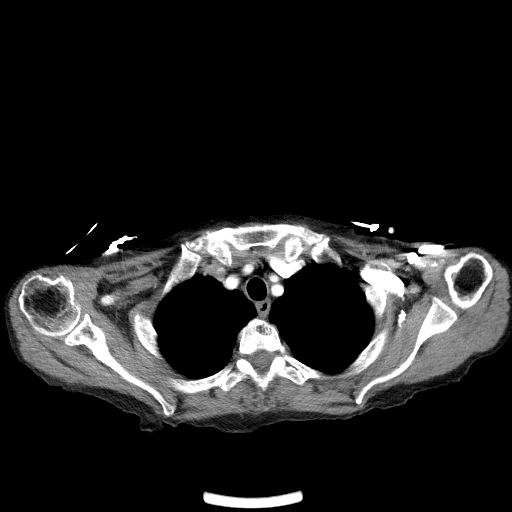
[im 118/131  bone]
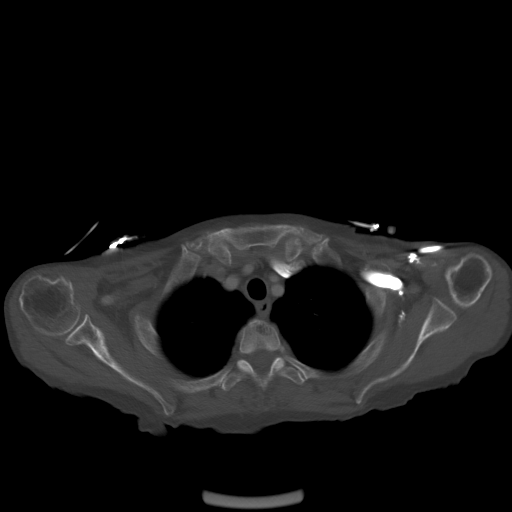

[Series 6: coronals · coronal · 0.66mm/px · 3 of 150 slices shown]
[im 30/150  mediastinal]
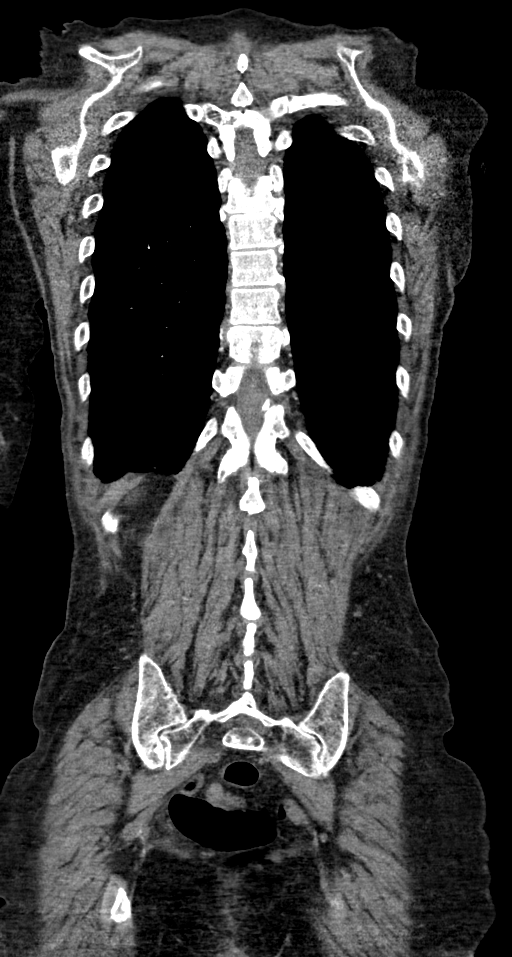
[im 60/150  mediastinal]
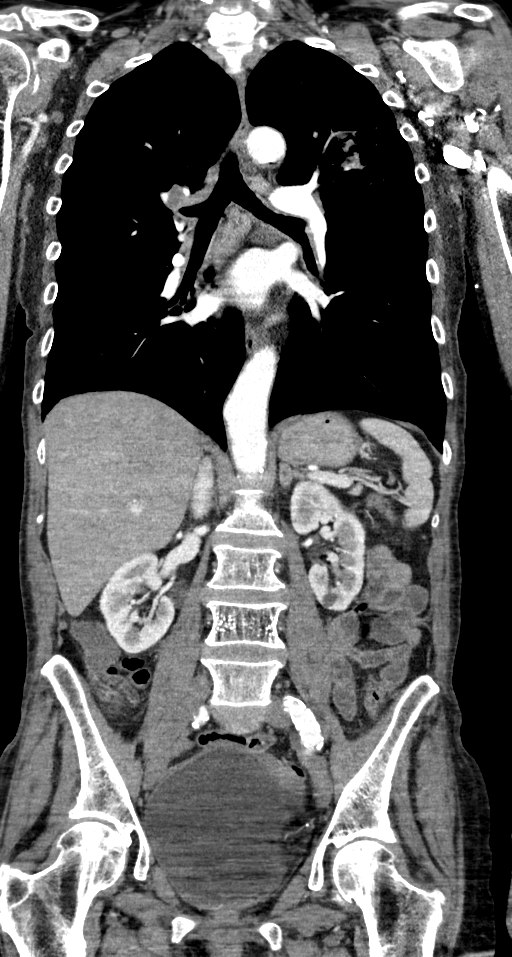
[im 90/150  mediastinal]
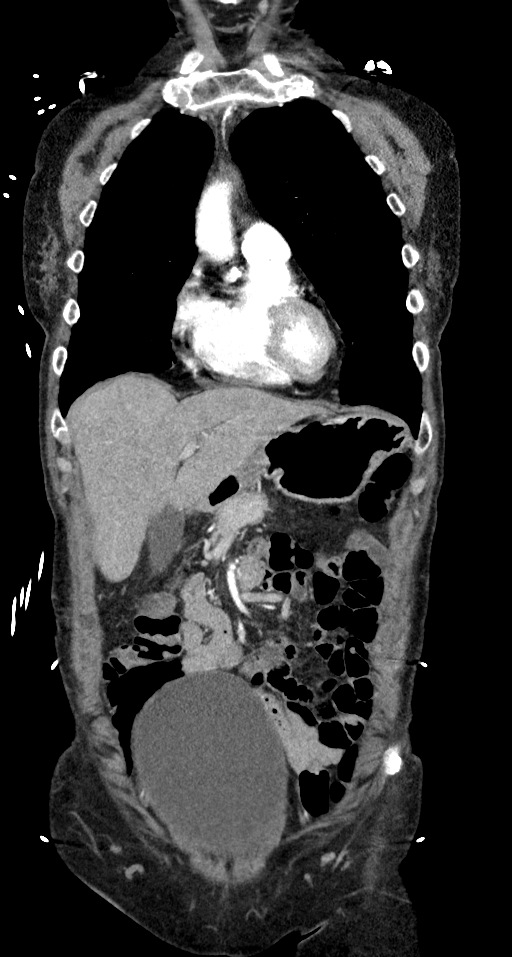

[12 of 36 positions shown; findings below may reference images not displayed]

FINDINGS: CT CHEST FINDINGS

Cardiovascular: The heart is unremarkable without pericardial
effusion. While this examination is not optimized for evaluation of
the pulmonary vasculature, I do not see any filling defects or
pulmonary emboli. No evidence of thoracic aortic aneurysm or
dissection. Moderate atherosclerosis throughout the aorta and
coronary vasculature.

Mediastinum/Nodes: Pathologic adenopathy within the mediastinum and
hila. Precarinal lymph node measures up to 15 mm in short axis,
reference image [DATE]. Thyroid, trachea, and esophagus are
unremarkable.

Lungs/Pleura: There is a spiculated left upper lobe mass measuring
4.3 x 3.9 x 3.0 cm, consistent with primary lung cancer. Right hilar
mass versus adenopathy identified, reference image 74/4, measuring
1.6 x 1.2 by 1.9 cm. This results in partial obstruction of right
upper lobe segmental bronchi.

No airspace disease, effusion, or pneumothorax.

Musculoskeletal: There are no acute or destructive bony lesions.
Reconstructed images demonstrate no additional findings.

CT ABDOMEN PELVIS FINDINGS

Hepatobiliary: Small gallstones without cholecystitis. The liver is
unremarkable.

Pancreas: Unremarkable. No pancreatic ductal dilatation or
surrounding inflammatory changes.

Spleen: Normal in size without focal abnormality.

Adrenals/Urinary Tract: Thickening of the medial limb of the left
adrenal gland measuring up to 8 mm. No discrete right adrenal
findings. Metastatic disease is not excluded.

The kidneys enhance normally and symmetrically. Bladder is markedly
distended, without filling defect.

Stomach/Bowel: Nonspecific wall thickening of the gastric cardia
measuring up to 2.1 cm. No bowel obstruction or ileus.

Vascular/Lymphatic: 3.7 cm infrarenal abdominal aortic aneurysm,
with extensive mural thrombus. Diffuse atherosclerosis. Chronic
appearing occlusion of the bilateral superficial femoral arteries,
with patent bilateral profundus femoral arteries.

No pathologic adenopathy within the abdomen or pelvis.

Reproductive: Uterus and bilateral adnexa are unremarkable.

Other: No free fluid or free gas.  No abdominal wall hernia.

Musculoskeletal: No acute or destructive bony lesions. Reconstructed
images demonstrate no additional findings.
IMPRESSION: 1. Large spiculated left upper lobe mass consistent with primary
lung cancer.
2. Right hilar mass versus adenopathy, with partial obstruction of
segmental right upper lobe bronchi.
3. Mediastinal and hilar adenopathy consistent with nodal
metastases.
4. Nonspecific left adrenal thickening, metastatic disease not
excluded.
5. Nonspecific wall thickening of the gastric cardia.
6. 3.7 cm infrarenal abdominal aortic aneurysm. Recommend follow-up
every 2 years.
Reference: [HOSPITAL] [TW];[DATE].
7. Chronic appearing occlusion of the bilateral superficial femoral
arteries. The bilateral profundus femoral arteries remain patent.
8. Cholelithiasis without cholecystitis.
9.  Aortic Atherosclerosis ([TW]-[TW]).

## 2021-04-26 IMAGING — CT CT HEAD W/O CM
4 series · 14 of 47 positions shown, 16 images · non-contrast
Comparison: None.

CLINICAL DATA: Right-sided weakness, stroke suspected

EXAM:
CT HEAD WITHOUT CONTRAST
TECHNIQUE: Contiguous axial images were obtained from the base of the skull
through the vertex without intravenous contrast.

[Series 2: head wo · axial · 0.47mm/px · z∈[+1294,+1409]mm · 6 of 33 slices shown, 8 images]
[im 5/33  brain]
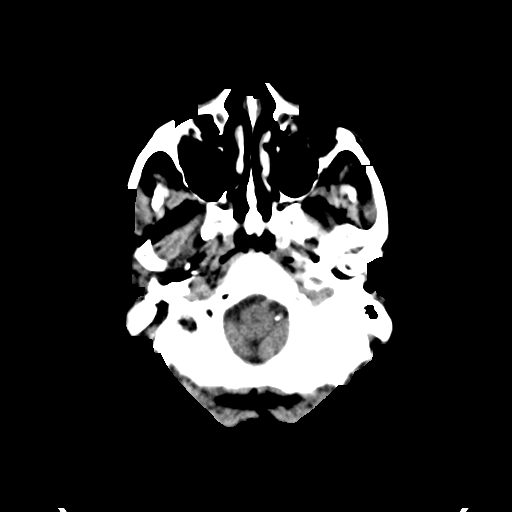
[im 5/33  bone]
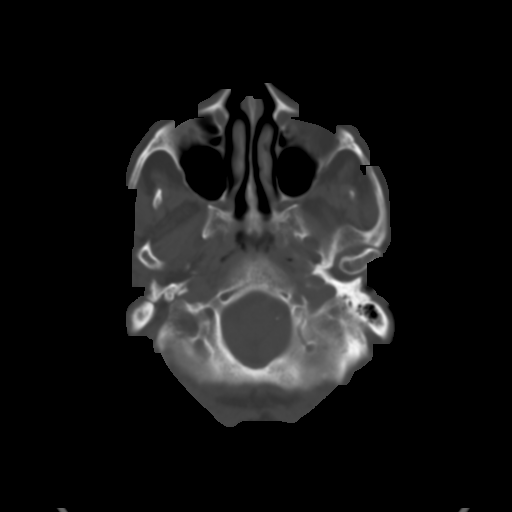
[im 10/33  brain]
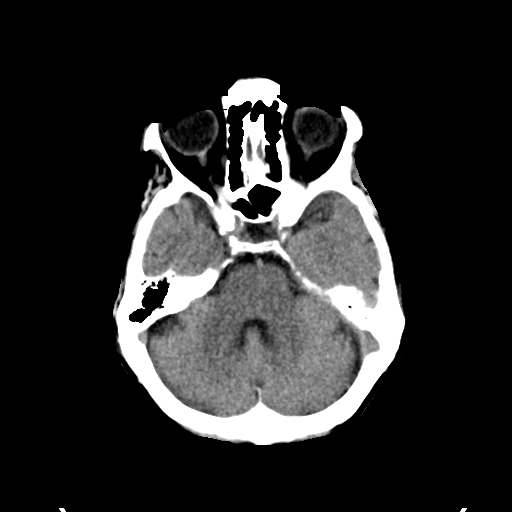
[im 14/33  brain]
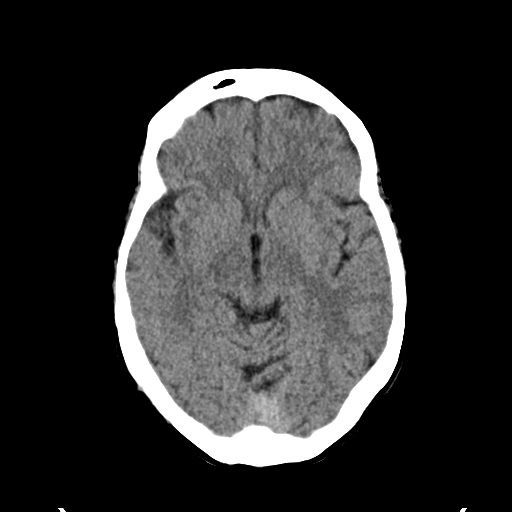
[im 19/33  brain]
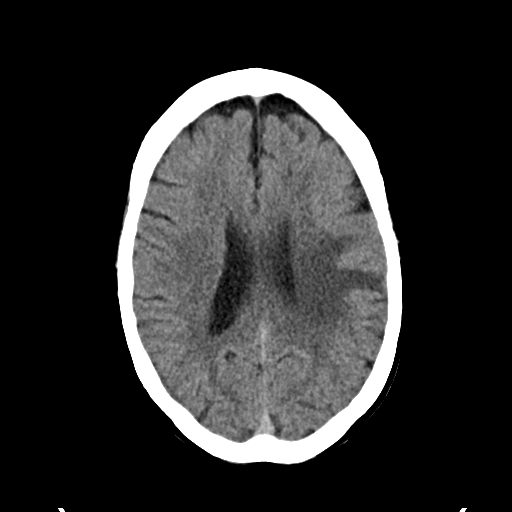
[im 23/33  brain]
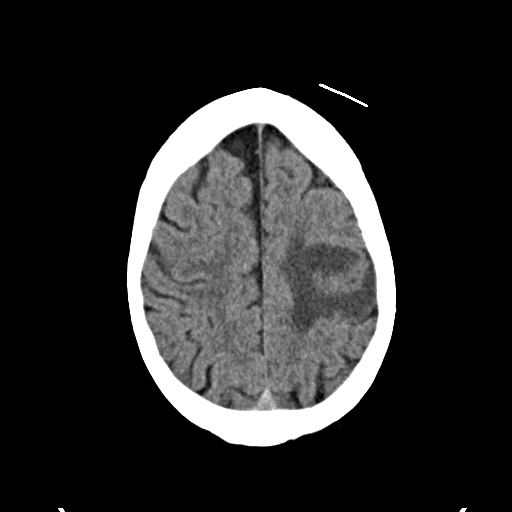
[im 23/33  bone]
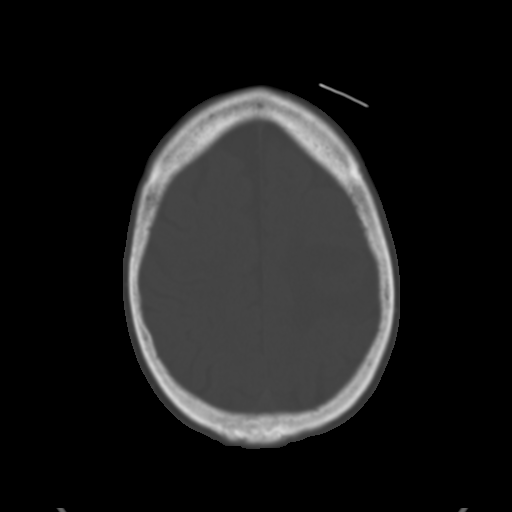
[im 28/33  brain]
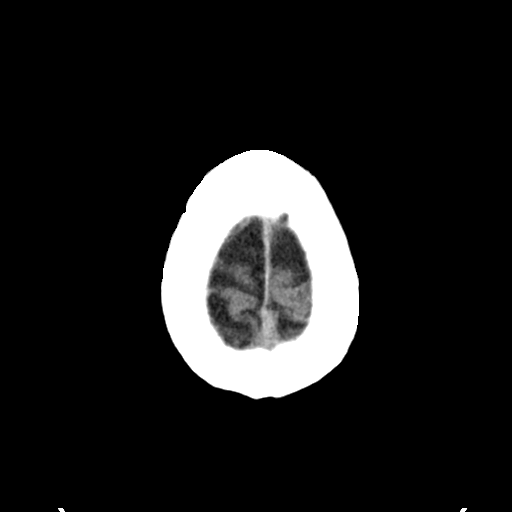

[Series 3: head bone · axial · 0.47mm/px · z∈[+1285,+1301]mm · 2 of 85 slices shown]
[im 9/85  bone]
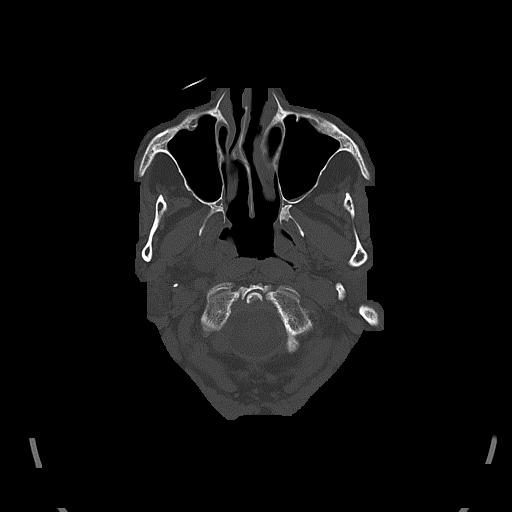
[im 17/85  bone]
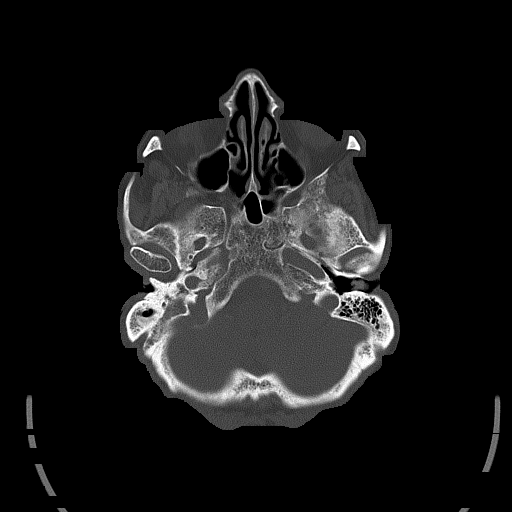

[Series 4: coronal soft · coronal · 0.33mm/px · 3 of 69 slices shown]
[im 23/69  brain]
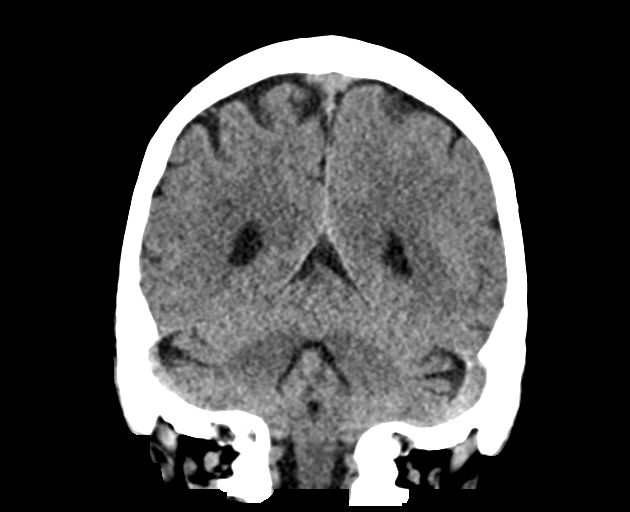
[im 31/69  brain]
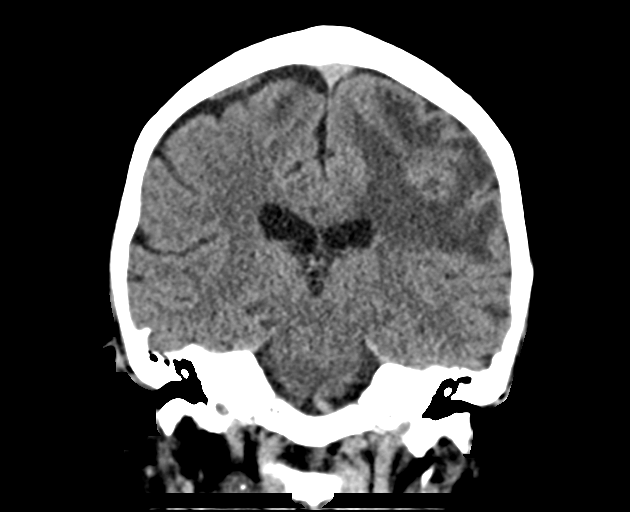
[im 38/69  brain]
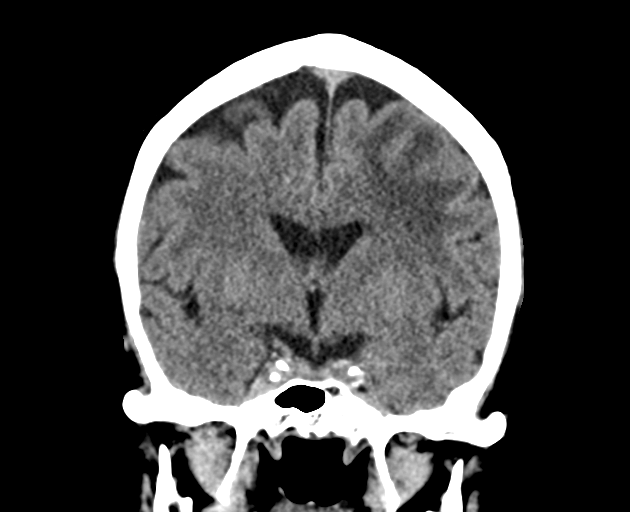

[Series 5: sag soft · sagittal · 0.33mm/px · 3 of 67 slices shown]
[im 23/67  brain]
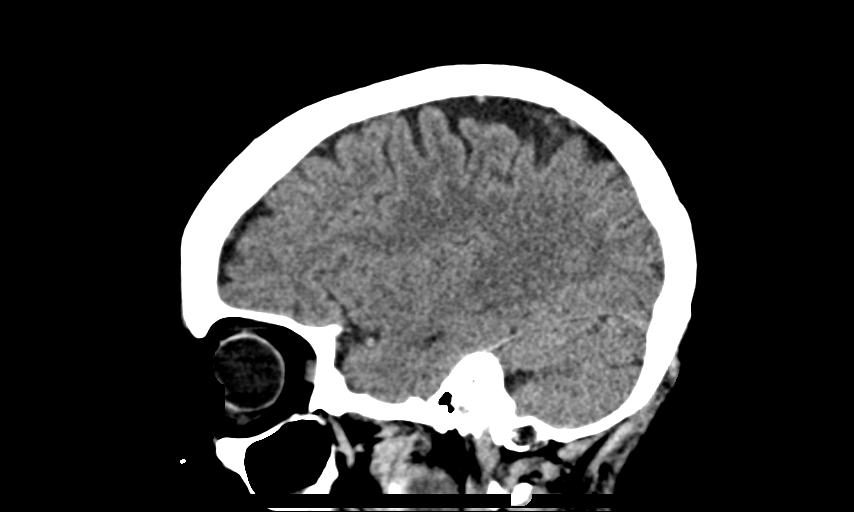
[im 34/67  brain]
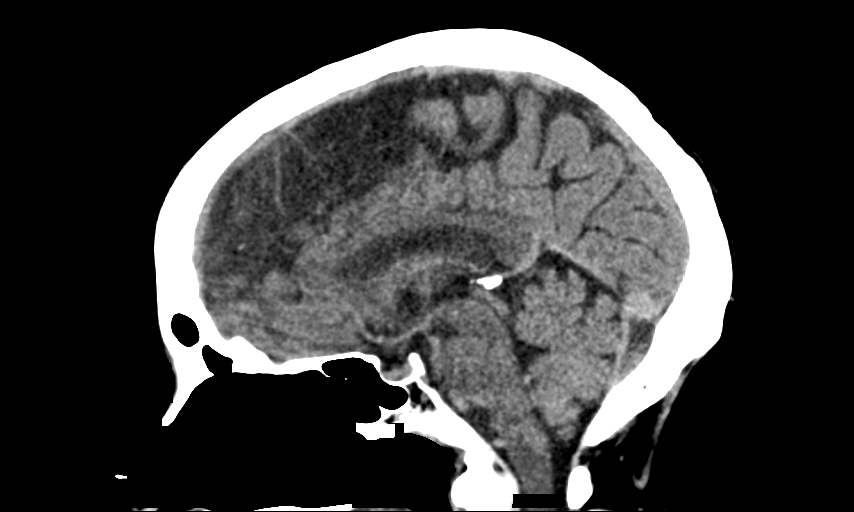
[im 45/67  brain]
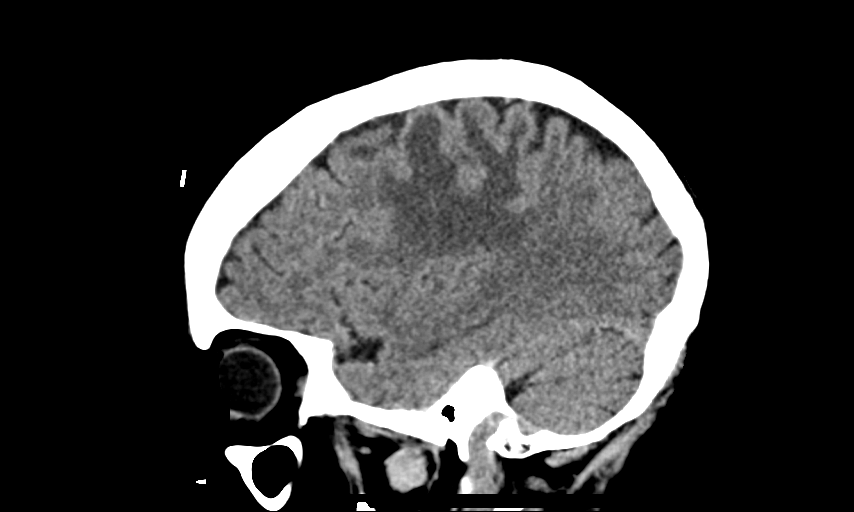

[14 of 47 positions shown; findings below may reference images not displayed]

FINDINGS: Brain: There is an approximately 1.7 cm intra-axial mass lesion in
the posterior left frontal lobe with moderate surrounding vasogenic
edema and minimal associated sulcal effacement. No additional
definite brain lesions or areas of vasogenic edema visualized. No
acute intracranial hemorrhage. No hydrocephalus. No extra-axial
fluid collections or herniation.

Vascular: Calcified plaques in the carotid siphons.

Skull: Normal. Negative for fracture or focal lesion.

Sinuses/Orbits: Mild chronic mucosal thickening in the maxillary
sinuses. No acute process identified.

Other: Partially visualized 2 x 1.9 cm rounded hypodense mass in the
right neck parapharyngeal space at the level of the nasopharynx.
IMPRESSION: 1. Approximally 1.7 cm left frontal lobe mass with moderate
surrounding vasogenic edema and minimal mass effect. May represent
metastatic or primary lesion. MRI brain with contrast recommended as
well as oncologic workup as indicated.
2. Partially visualized hypodense mass in the right neck
parapharyngeal space. Indeterminate but could also represent
metastasis given the brain findings. Could also be evaluated further
with CT neck with contrast.

## 2021-04-26 MED ORDER — DEXAMETHASONE SODIUM PHOSPHATE 4 MG/ML IJ SOLN
4.0000 mg | Freq: Four times a day (QID) | INTRAMUSCULAR | Status: DC
  Administered 2021-04-26 – 2021-04-28 (×8): 4 mg via INTRAVENOUS
  Filled 2021-04-26 (×7): qty 1

## 2021-04-26 MED ORDER — LACTATED RINGERS IV BOLUS
1000.0000 mL | Freq: Once | INTRAVENOUS | Status: AC
  Administered 2021-04-26: 1000 mL via INTRAVENOUS

## 2021-04-26 MED ORDER — IOHEXOL 300 MG/ML  SOLN
100.0000 mL | Freq: Once | INTRAMUSCULAR | Status: AC | PRN
  Administered 2021-04-26: 100 mL via INTRAVENOUS

## 2021-04-26 MED ORDER — DEXAMETHASONE SODIUM PHOSPHATE 10 MG/ML IJ SOLN
10.0000 mg | Freq: Once | INTRAMUSCULAR | Status: AC
  Administered 2021-04-26: 10 mg via INTRAVENOUS
  Filled 2021-04-26: qty 1

## 2021-04-26 NOTE — ED Notes (Signed)
Pt transported to CT ?

## 2021-04-26 NOTE — ED Notes (Signed)
Unable to provide urine specimen at this time.  

## 2021-04-26 NOTE — ED Provider Notes (Signed)
[facility] Provider Note   CSN: [id number] Arrival date & time: 04/26/21  1337     History  Chief Complaint  Patient presents with   Hand Problem    Cynthia Gonzalez is a 71 y.o. female with history of hyperlipidemia and smoking for greater than 40 years who presents with concern for 3 weeks of right wrist and hand swelling and weakness.  She states "my hand just stopped working a couple weeks ago".  States that she woke up about 3 weeks ago and noticed that her hand was weak and mildly swollen.  She was trying to make a fist and then extend her fingers to encourage the swelling to dissipate, however noticed that she was not able to do that.  No worsening of her symptoms since that time besides mild increase in her swelling.  Denies any pain in the hand, warmth to the hand, recent injury to the hand.  I have personally reviewed her medical records.  In addition to the above listed medical history, patient has had 6 children.  She states that she is not on any medications daily though her medical record states that she supposed to be on simvastatin.  HPI     Home Medications Prior to Admission medications   Medication Sig Start Date End Date Taking? Authorizing Provider  Calcium Carbonate (CALCIUM 600 PO) Take 600 mg by mouth daily.    [provider]  Cholecalciferol (VITAMIN D3) 2000 UNITS TABS Take 2,000 Units by mouth daily.    [provider]  Multiple Vitamin (MULTIVITAMIN) tablet Take 1 tablet by mouth daily.    [provider]  Na Sulfate-K Sulfate-Mg Sulf (SUPREP BOWEL PREP) SOLN Take 1 kit by mouth once. suprep as directed. No substitutions 08/20/14   Lafayette Dragon, MD  simvastatin (ZOCOR) 20 MG tablet Take 20 mg by mouth daily at 6 PM.    [provider]      Allergies    Patient has no known allergies.    Review of Systems   Review of Systems  Constitutional: Negative.   HENT: Negative.    Eyes:  Negative.  Negative for photophobia and visual disturbance.  Respiratory: Negative.    Cardiovascular: Negative.   Gastrointestinal: Negative.   Genitourinary: Negative.   Musculoskeletal: Negative.   Neurological:  Positive for weakness. Negative for dizziness, tremors, seizures, syncope, facial asymmetry, speech difficulty, light-headedness, numbness and headaches.  Hematological: Negative.   Psychiatric/Behavioral: Negative.     Physical Exam Updated Vital Signs BP (!) 147/67 (BP Location: Right Arm)    Pulse (!) 121    Temp 98 F (36.7 C) (Oral)    Resp 20    Ht _0  (1.651 m)    Wt 53.5 kg    SpO2 96%    BMI 19.64 kg/m  Physical Exam Vitals and nursing note reviewed.  Constitutional:      Appearance: She is not ill-appearing or toxic-appearing.  HENT:     Head: Normocephalic and atraumatic.     Nose: Nose normal. No congestion.     Mouth/Throat:     Mouth: Mucous membranes are moist.     Pharynx: Oropharynx is clear. Uvula midline. No oropharyngeal exudate or posterior oropharyngeal erythema.     Tonsils: No tonsillar exudate.  Eyes:     General: Lids are normal. Vision grossly intact. No visual field deficit.       Right eye: No discharge.  Left eye: No discharge.     Extraocular Movements: Extraocular movements intact.     Conjunctiva/sclera: Conjunctivae normal.     Pupils: Pupils are equal, round, and reactive to light.  Neck:     Trachea: Trachea and phonation normal.  Cardiovascular:     Rate and Rhythm: Normal rate and regular rhythm.     Pulses: Normal pulses.          Radial pulses are 2+ on the right side and 2+ on the left side.       Dorsalis pedis pulses are 2+ on the right side and 2+ on the left side.     Heart sounds: Normal heart sounds. No murmur heard.    Comments: Normal left-sided brachial pulses well  No upper or lower extremity edema, warmth of the touch, or tenderness palpation to suggest a DVT Pulmonary:     Effort: Pulmonary effort is  normal. No tachypnea, bradypnea, accessory muscle usage, prolonged expiration or respiratory distress.     Breath sounds: Normal breath sounds. No wheezing or rales.  Chest:     Chest wall: No mass, lacerations, deformity, swelling, tenderness, crepitus or edema.  Abdominal:     General: Bowel sounds are normal. There is no distension.     Palpations: Abdomen is soft.     Tenderness: There is no abdominal tenderness. There is no right CVA tenderness, left CVA tenderness, guarding or rebound.  Musculoskeletal:        General: No deformity.       Hands:     Cervical back: Normal range of motion and neck supple. No edema, rigidity, tenderness or crepitus. No pain with movement, spinous process tenderness or muscular tenderness.     Right lower leg: No edema.     Left lower leg: No edema.  Lymphadenopathy:     Cervical: No cervical adenopathy.  Skin:    General: Skin is warm and dry.     Capillary Refill: Capillary refill takes less than 2 seconds.  Neurological:     Mental Status: She is alert and oriented to person, place, and time. Mental status is at baseline.     GCS: GCS eye subscore is 4. GCS verbal subscore is 5. GCS motor subscore is 6.     Cranial Nerves: Facial asymmetry present. No dysarthria.     Sensory: Sensation is intact.     Motor: Weakness present. No tremor or pronator drift.     Coordination: Romberg sign negative. Heel to [facility] Test normal.     Gait: Gait is intact.     Deep Tendon Reflexes:     Reflex Scores:      Bicep reflexes are 2+ on the right side and 2+ on the left side.      Patellar reflexes are 2+ on the right side and 2+ on the left side.    Comments: Slight left sided drooping of the smile; initially equal but patient unable to sustain with the left side.  No other facial droop, no dysarthria.  5/5 grip strength on the left, 1/5 on the right.  Patient with near complete extension of the second through fifth digits of the right hand but unable to abduct  the right thumb, unable to fully flex the fingers.  Unable to splay the fingers out.  Right-sided wrist drop with 2/5 strength in wrist extension against resistance on the right with 5/5 strength wrist extension on the left against resistance.  Psychiatric:  Mood and Affect: Mood normal.    ED Results / Procedures / Treatments   Labs (all labs ordered are listed, but only abnormal results are displayed) Labs Reviewed  CBC - Abnormal; Notable for the following components:      Result Value   Platelets 461 (*)    All other components within normal limits  COMPREHENSIVE METABOLIC PANEL - Abnormal; Notable for the following components:   AST 12 (*)    All other components within normal limits  RESP PANEL BY RT-PCR (FLU A&B, COVID) ARPGX2  ETHANOL  PROTIME-INR  APTT  DIFFERENTIAL  RAPID URINE DRUG SCREEN, HOSP PERFORMED  URINALYSIS, ROUTINE W REFLEX MICROSCOPIC    EKG EKG Interpretation  Date/Time:  Wednesday April 26 2021 15:52:29 EST Ventricular Rate:  79 PR Interval:  136 QRS Duration: 107 QT Interval:  376 QTC Calculation: 431 R Axis:   84 Text Interpretation: Sinus rhythm Borderline right axis deviation No previous ECGs available Confirmed by Fredia Sorrow [phone] on 04/26/2021 3:55:05 PM  Radiology CT HEAD WO CONTRAST  Result Date: 04/26/2021 CLINICAL DATA:  Right-sided weakness, stroke suspected EXAM: CT HEAD WITHOUT CONTRAST TECHNIQUE: Contiguous axial images were obtained from the base of the skull through the vertex without intravenous contrast. COMPARISON:  None. FINDINGS: Brain: There is an approximately 1.7 cm intra-axial mass lesion in the posterior left frontal lobe with moderate surrounding vasogenic edema and minimal associated sulcal effacement. No additional definite brain lesions or areas of vasogenic edema visualized. No acute intracranial hemorrhage. No hydrocephalus. No extra-axial fluid collections or herniation. Vascular: Calcified plaques in the  carotid siphons. Skull: Normal. Negative for fracture or focal lesion. Sinuses/Orbits: Mild chronic mucosal thickening in the maxillary sinuses. No acute process identified. Other: Partially visualized 2 x 1.9 cm rounded hypodense mass in the right neck parapharyngeal space at the level of the nasopharynx. IMPRESSION: 1. Approximally 1.7 cm left frontal lobe mass with moderate surrounding vasogenic edema and minimal mass effect. May represent metastatic or primary lesion. MRI brain with contrast recommended as well as oncologic workup as indicated. 2. Partially visualized hypodense mass in the right neck parapharyngeal space. Indeterminate but could also represent metastasis given the brain findings. Could also be evaluated further with CT neck with contrast. Electronically Signed   By: Ofilia Neas M.D.   On: 04/26/2021 15:57   CT Soft Tissue Neck W Contrast  Result Date: 04/26/2021 CLINICAL DATA:  Right parapharyngeal mass noted on head CT EXAM: CT NECK WITH CONTRAST TECHNIQUE: Multidetector CT imaging of the neck was performed using the standard protocol following the bolus administration of intravenous contrast. CONTRAST:  110m OMNIPAQUE IOHEXOL 300 MG/ML  SOLN COMPARISON:  No prior neck CT, correlation is made with 04/26/2021 CT head FINDINGS: Pharynx and larynx: Normal. No mass or swelling. Salivary glands: No inflammation, mass, or stone. Thyroid: Subcentimeter lesion in the left thyroid lobe (series 3, image 80). Otherwise negative. Lymph nodes: Heterogeneously enhancing, centrally hypodense mass in the right parapharyngeal space, favored to represent an enlarged right level 2A lymph node, measuring up to 2.2 x 2.8 x 3.1 cm (AP x TR x CC) (series 3, image 37 and series 7, image 63). No other enlarged or abnormal appearing lymph nodes. Vascular: Negative. Limited intracranial: Negative. Previously noted left frontal lobe mass is not included in the field of view. Visualized orbits: Status post  bilateral lens replacements. Otherwise negative. Mastoids and visualized paranasal sinuses: Mucosal thickening in the bilateral maxillary sinuses. Otherwise negative. Skeleton: Degenerative changes in the cervical  spine. No acute osseous abnormality. Upper chest: Please see same-day CT chest Other: None. IMPRESSION: 1. Heterogeneously enhancing, centrally hypodense mass in the right parapharyngeal space, favored to represent an enlarged, necrotic right level 2A lymph node, concerning for metastatic disease. Biopsy is recommended. 2. No other acute finding in the neck. Electronically Signed   By: Merilyn Baba M.D.   On: 04/26/2021 18:48   CT CHEST ABDOMEN PELVIS W CONTRAST  Result Date: 04/26/2021 CLINICAL DATA:  Right parapharyngeal mass, left temporal lobe mass, concern for metastatic disease EXAM: CT CHEST, ABDOMEN, AND PELVIS WITH CONTRAST TECHNIQUE: Multidetector CT imaging of the chest, abdomen and pelvis was performed following the standard protocol during bolus administration of intravenous contrast. CONTRAST:  123m OMNIPAQUE IOHEXOL 300 MG/ML  SOLN COMPARISON:  None. FINDINGS: CT CHEST FINDINGS Cardiovascular: The heart is unremarkable without pericardial effusion. While this examination is not optimized for evaluation of the pulmonary vasculature, I do not see any filling defects or pulmonary emboli. No evidence of thoracic aortic aneurysm or dissection. Moderate atherosclerosis throughout the aorta and coronary vasculature. Mediastinum/Nodes: Pathologic adenopathy within the mediastinum and hila. Precarinal lymph node measures up to 15 mm in short axis, reference image 29/3. Thyroid, trachea, and esophagus are unremarkable. Lungs/Pleura: There is a spiculated left upper lobe mass measuring 4.3 x 3.9 x 3.0 cm, consistent with primary lung cancer. Right hilar mass versus adenopathy identified, reference image 74/4, measuring 1.6 x 1.2 by 1.9 cm. This results in partial obstruction of right upper lobe  segmental bronchi. No airspace disease, effusion, or pneumothorax. Musculoskeletal: There are no acute or destructive bony lesions. Reconstructed images demonstrate no additional findings. CT ABDOMEN PELVIS FINDINGS Hepatobiliary: Small gallstones without cholecystitis. The liver is unremarkable. Pancreas: Unremarkable. No pancreatic ductal dilatation or surrounding inflammatory changes. Spleen: Normal in size without focal abnormality. Adrenals/Urinary Tract: Thickening of the medial limb of the left adrenal gland measuring up to 8 mm. No discrete right adrenal findings. Metastatic disease is not excluded. The kidneys enhance normally and symmetrically. Bladder is markedly distended, without filling defect. Stomach/Bowel: Nonspecific wall thickening of the gastric cardia measuring up to 2.1 cm. No bowel obstruction or ileus. Vascular/Lymphatic: 3.7 cm infrarenal abdominal aortic aneurysm, with extensive mural thrombus. Diffuse atherosclerosis. 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 No pathologic adenopathy within the abdomen or pelvis. Reproductive: Uterus and bilateral adnexa are unremarkable. Other: No free fluid or free gas.  No abdominal wall hernia. Musculoskeletal: No acute or destructive bony lesions. Reconstructed images demonstrate no additional findings. IMPRESSION: 1. Large spiculated left upper lobe mass consistent with primary lung cancer. 2. Right hilar mass versus adenopathy, with partial obstruction of segmental right upper lobe bronchi. 3. Mediastinal and hilar adenopathy consistent with nodal metastases. 4. Nonspecific left adrenal thickening, metastatic disease not excluded. 5. Nonspecific wall thickening of the gastric cardia. 6. 3.7 cm infrarenal abdominal aortic aneurysm. Recommend follow-up every 2 years. Reference: J Am Coll Radiol 24656;81:275-170 7. 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 The bilateral profundus femoral arteries remain patent. 8. Cholelithiasis without cholecystitis. 9.  Aortic Atherosclerosis (ICD10-I70.0). Electronically Signed   By: MRanda NgoM.D.   On: 04/26/2021 18:52    Procedures .Critical Care Performed by: SEmeline Darling PA-C Authorized by: SEmeline Darling PA-C   Critical care provider statement:    Critical care time (minutes):  45   Critical care was time spent personally by me on the following activities:  Development  of treatment plan with patient or surrogate, discussions with consultants, evaluation of patient's response to treatment, examination of patient, obtaining history from patient or surrogate, ordering and performing treatments and interventions, ordering and review of laboratory studies, ordering and review of radiographic studies, pulse oximetry and re-evaluation of patient's condition    Medications Ordered in ED Medications  dexamethasone (DECADRON) injection 4 mg (4 mg Intravenous Given 04/27/21 0013)  dexamethasone (DECADRON) injection 10 mg (10 mg Intravenous Given 04/26/21 1748)  iohexol (OMNIPAQUE) 300 MG/ML solution 100 mL (100 mLs Intravenous Contrast Given 04/26/21 1813)  lactated ringers bolus 1,000 mL (1,000 mLs Intravenous New Bag/Given 04/26/21 2124)    ED Course/ Medical Decision Making/ A&P Clinical Course as of 04/27/21 0041  Wed Apr 26, 2021  1744 Consulted Dr. Burr Medico, medical oncology, who recommends proceeding with CT of the soft tissues of the neck, chest, abdomen, and pelvis with contrast to evaluate for likely new malignancy.  Requesting admission to [facility] medicine service. [RS]  1610 Consult to neurosurgery APP, Joelene Millin, who is requesting admission to [facility], stating that patient can be transported to [facility] if necessary for radiation treatments.  MRI of the brain with and without contrast when she arrives at [facility].  I appreciate her  collaboration in the care of this patient. [RS]  9604 Consult  [RS]    Clinical Course User Index [RS] Cleto Claggett, Gypsy Balsam, PA-C                           Medical Decision Making This patient presents to the ED for concern of right hand weakness and loss of motor function, this involves an extensive number of treatment options, and is a complaint that carries with it a high risk of complications and morbidity.  The differential diagnosis includes CVA, intracranial mass or hemorrhage, spinal cord impingement of the C-spine, peripheral neuropathy, Saturday night palsy.  Patient mildly tachycardic and hypertensive on intake of medicines otherwise normal.  Cardiopulmonary exam is normal, abdominal exam is benign.  Patient with very subtle left-sided facial droop on cranial nerve exam but otherwise normal cranial nerve evaluation.  Exquisite deficit in right upper extremity motor function.  Patient with right wrist drop, unable to participate in grip strength evaluation due to lack of range of motion since onset of symptoms.  No pronator drift or Romberg.  Lower extremity evaluation and reflexes are normal as above.  Comorbidities that complicate the patient evaluation: Hyperlipidemia   Additional history obtained:  Additional history obtained from chart review and daughter at the bedside. External records from outside source obtained and reviewed including none   Lab Tests:  I Ordered, reviewed, and interpreted labs.  The pertinent results include:   CBC without leukocytosis or anemia.  CMP with normal electrolyte, liver, and kidney function.  Coag studies are normal and respiratory pathogen panel is negative.    Imaging Studies ordered:  I ordered imaging studies including CT of the head, CT of the soft tissues of the neck, and CT of the chest abdomen pelvis. I independently visualized and interpreted imaging which showed 1.7 cm left frontal lobe mass with associated edema and  mass-effect on CT of the head.  Subsequent imaging obtained revealed apparent necrotic lymph node in the right parapharyngeal space and large spiculated left upper lobe mass consistent with primary lung cancer with associated chest lymphadenopathy. I agree with the radiologist interpretation.   Cardiac Monitoring:  The patient  was maintained on a cardiac monitor.  I personally viewed and interpreted the cardiac monitored which showed an underlying rhythm of: Normal sinus rhythm with intermittent sinus tachycardia during conversations about the patient's concerning CT findings.   Medicines ordered and prescription drug management:  I ordered medication including Decadron for vasogenic edema in the brain. Reevaluation of the patient after these medicines showed that the patient stayed the same.  Remains cognitively intact.  Neurodeficits in the right upper extremity remain. I have reviewed the patients home medicines and have made adjustments as needed  Tests Considered: MRI of the brain with and without contrast, however do not have MRI capabilities at this facility.  Will await transfer to admitting facility.   Critical Interventions:  IV steroid administration and scheduled steroid dosing moving forward for vasogenic edema in the brain.   Consultations Obtained:  I requested consultation with the neurosurgeon, oncologist, and hospitalist,  and discussed lab and imaging findings as well as pertinent plan - they recommend: MRI of the brain with and without contrast, steroids as previously ordered, and admission to [facility] for further work-up of likely new metastatic lung cancer.  Neurosurgery and oncology to follow.   Problem List / ED Course: Right upper extremity weakness: Focal neurodeficit on exam consistent with intracranial mass identified on imaging. Intracranial mass with associated vasogenic edema: Steroids administered.  Patient mains cognitively intact without  changes in neuro examination. Intermittent tachycardia: Appears to be anxiety driven as tachycardia occurs primarily during discussions of her abnormal CT findings as well as discussions of admission to the hospital and transport by ambulance.  Resolves with this provider is out of the room patient is along with her daughter.  Fluid bolus given    Reevaluation:  After the interventions noted above, I reevaluated the patient and found that they have :stayed the same  Social Determinants of Health Patient lives alone, smokes approximately 1 pack a day of cigarettes.  Dispostion:  After consideration of the diagnostic results and the patients response to treatment feel that the patent would benefit from admission to the hospital for completion of work-up her apparent new diagnosis of metastatic malignancy.  Jacquelynn voiced understanding of her medical evaluation and treatment plan thus far.  Each of her questions was answered to her expressed satisfaction.  This chart was dictated using voice recognition software, Dragon. Despite the best efforts of this provider to proofread and correct errors, errors may still occur which can change documentation meaning.     Final Clinical Impression(s) / ED Diagnoses Final diagnoses:  None    Rx / DC Orders ED Discharge Orders     None         Emeline Darling, PA-C 04/27/21 0046    Fredia Sorrow, MD 05/04/21 [phone]

## 2021-04-26 NOTE — ED Triage Notes (Signed)
Pt states "my hand just stopped working a couple weeks ago"-c/o pain to attempt to grip and swelling x 3 weeks-denies injury-NAD-steady gait

## 2021-04-27 ENCOUNTER — Inpatient Hospital Stay (HOSPITAL_COMMUNITY): Payer: Medicare Other

## 2021-04-27 ENCOUNTER — Ambulatory Visit
Admit: 2021-04-27 | Discharge: 2021-04-27 | Disposition: A | Discharge status: Home / Self Care | Payer: Medicare Other | Source: Ambulatory Visit | Attending: Radiation Oncology | Admitting: Radiation Oncology

## 2021-04-27 ENCOUNTER — Other Ambulatory Visit: Payer: Self-pay | Admitting: Radiation Therapy

## 2021-04-27 ENCOUNTER — Encounter (HOSPITAL_COMMUNITY): Payer: Self-pay | Admitting: Internal Medicine

## 2021-04-27 DIAGNOSIS — C7931 Secondary malignant neoplasm of brain: Secondary | ICD-10-CM | POA: Insufficient documentation

## 2021-04-27 DIAGNOSIS — G9389 Other specified disorders of brain: Secondary | ICD-10-CM | POA: Diagnosis not present

## 2021-04-27 LAB — COMPREHENSIVE METABOLIC PANEL
ALT: 8 U/L (ref 0–44)
AST: 12 U/L — ABNORMAL LOW (ref 15–41)
Albumin: 3.3 g/dL — ABNORMAL LOW (ref 3.5–5.0)
Alkaline Phosphatase: 105 U/L (ref 38–126)
Anion gap: 9 (ref 5–15)
BUN: 9 mg/dL (ref 8–23)
CO2: 21 mmol/L — ABNORMAL LOW (ref 22–32)
Calcium: 9.5 mg/dL (ref 8.9–10.3)
Chloride: 105 mmol/L (ref 98–111)
Creatinine, Ser: 0.68 mg/dL (ref 0.44–1.00)
GFR, Estimated: >60 mL/min (ref >60)
Glucose, Bld: 171 mg/dL — ABNORMAL HIGH (ref 70–99)
Potassium: 4.2 mmol/L (ref 3.5–5.1)
Sodium: 135 mmol/L (ref 135–145)
Total Bilirubin: 0.4 mg/dL (ref 0.3–1.2)
Total Protein: 6.7 g/dL (ref 6.5–8.1)

## 2021-04-27 LAB — RAPID URINE DRUG SCREEN, HOSP PERFORMED
Amphetamines: NOT DETECTED
Barbiturates: NOT DETECTED
Benzodiazepines: NOT DETECTED
Cocaine: NOT DETECTED
Opiates: NOT DETECTED
Tetrahydrocannabinol: NOT DETECTED

## 2021-04-27 LAB — CBC
HCT: 38.7 % (ref 36.0–46.0)
Hemoglobin: 13.1 g/dL (ref 12.0–15.0)
MCH: 29.7 pg (ref 26.0–34.0)
MCHC: 33.9 g/dL (ref 30.0–36.0)
MCV: 87.8 fL (ref 80.0–100.0)
Platelets: 418 10*3/uL — ABNORMAL HIGH (ref 150–400)
RBC: 4.41 MIL/uL (ref 3.87–5.11)
RDW: 12.5 % (ref 11.5–15.5)
WBC: 3.3 10*3/uL — ABNORMAL LOW (ref 4.0–10.5)
nRBC: 0 % (ref 0.0–0.2)

## 2021-04-27 LAB — URINALYSIS, ROUTINE W REFLEX MICROSCOPIC
Bilirubin Urine: NEGATIVE
Glucose, UA: NEGATIVE mg/dL
Hgb urine dipstick: NEGATIVE
Ketones, ur: NEGATIVE mg/dL
Leukocytes,Ua: NEGATIVE
Nitrite: NEGATIVE
Protein, ur: NEGATIVE mg/dL
Specific Gravity, Urine: 1.025 (ref 1.005–1.030)
pH: 7 (ref 5.0–8.0)

## 2021-04-27 LAB — TSH: TSH: 0.367 u[IU]/mL (ref 0.350–4.500)

## 2021-04-27 IMAGING — MR MR HEAD WO/W CM
14 of 16 series · 42 of 48 positions shown · IV contrast (gadavist)
Comparison: Multiple previous CTs from [DATE].

CLINICAL DATA: Initial evaluation for brain CNS neoplasm.

EXAM:
MRI HEAD WITHOUT AND WITH CONTRAST
TECHNIQUE: Multiplanar, multiecho pulse sequences of the brain and surrounding
structures were obtained without and with intravenous contrast.
CONTRAST:  5mL GADAVIST GADOBUTROL 1 MMOL/ML IV SOLN

[Series 5: DWI · axial · 3.0mm · 0.92mm/px · z∈[-85,+75]mm · 8 of 112 slices shown (1 of 4)]
[im 1/112]
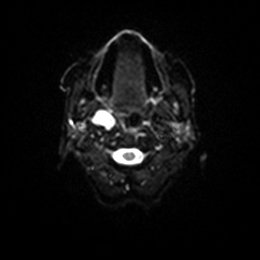
[im 16/112]
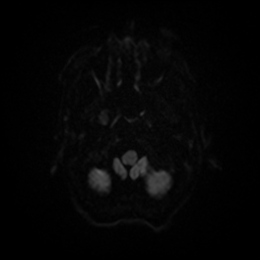
[im 32/112]
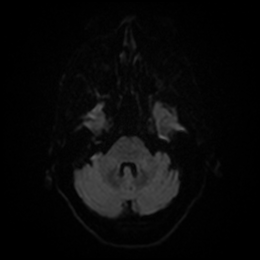
[im 48/112]
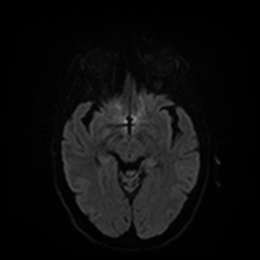
[im 64/112]
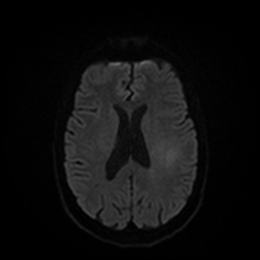
[im 80/112]
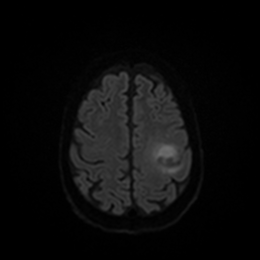
[im 96/112]
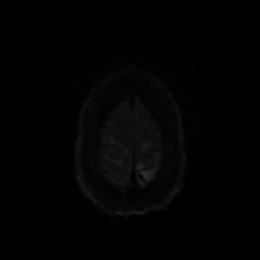
[im 112/112]
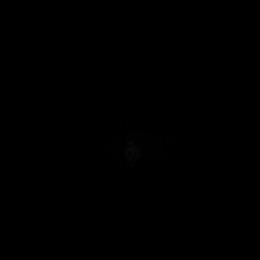

[Series 6: DWI · axial · 3.0mm · 0.92mm/px · z∈[-85,+75]mm · 3 of 54 slices shown (2 of 4)]
[im 1/54]
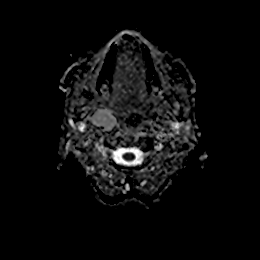
[im 27/54]
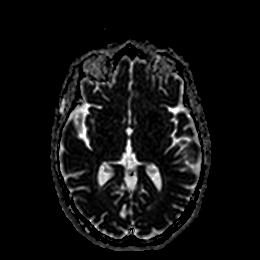
[im 54/54]
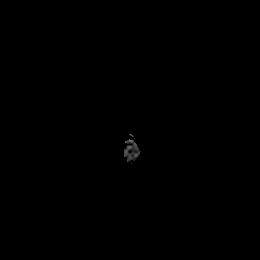

[Series 7: DWI · coronal · 4.0mm · 0.88mm/px · 5 of 78 slices shown (3 of 4)]
[im 1/78]
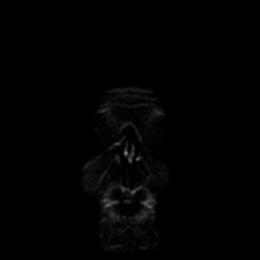
[im 20/78]
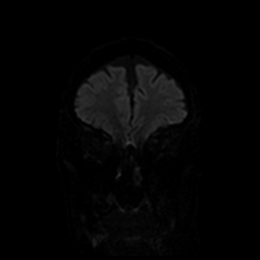
[im 39/78]
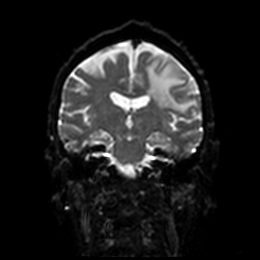
[im 58/78]
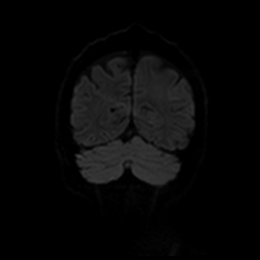
[im 78/78]
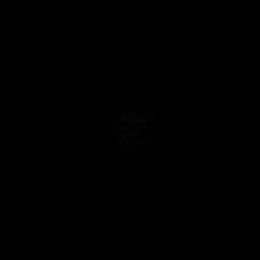

[Series 8: DWI · coronal · 4.0mm · 0.88mm/px · 2 of 39 slices shown (4 of 4)]
[im 1/39]
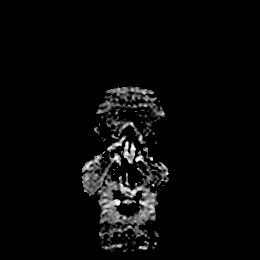
[im 39/39]
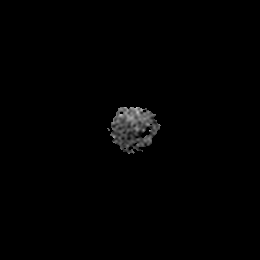

[Series 9: T1 · sagittal · 5.0mm · 0.75mm/px · 2 of 27 slices shown]
[im 1/27]
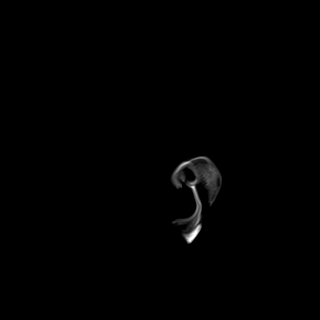
[im 27/27]
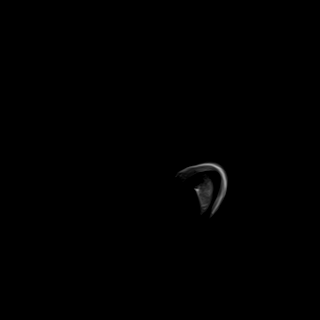

[Series 10: T2 · axial · 5.0mm · 0.75mm/px · z∈[-87,+76]mm · 2 of 29 slices shown]
[im 1/29]
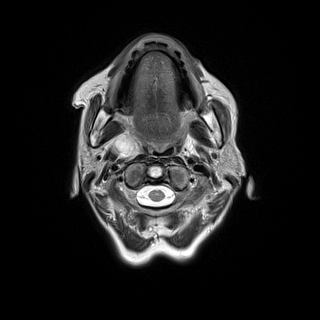
[im 29/29]
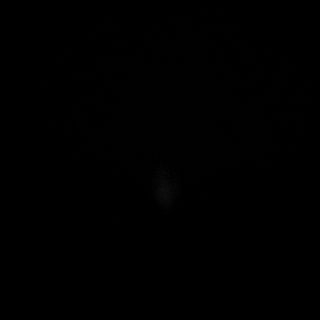

[Series 11: FLAIR · axial · 5.0mm · 0.47mm/px · z∈[-89,+74]mm · 2 of 29 slices shown]
[im 1/29]
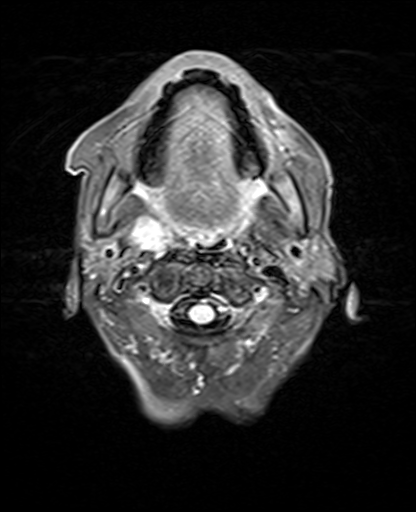
[im 29/29]
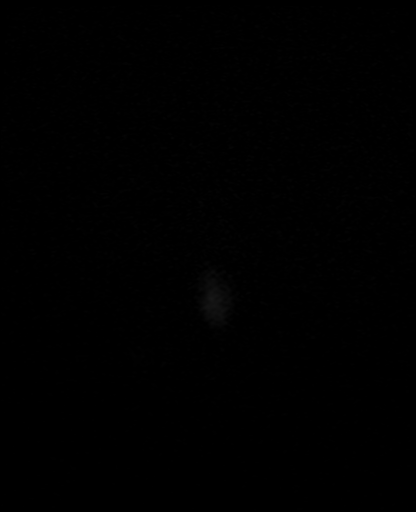

[Series 12: mag_images · axial · 3.0mm · 0.94mm/px · z∈[-88,+72]mm · 3 of 56 slices shown]
[im 1/56]
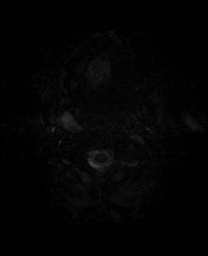
[im 28/56]
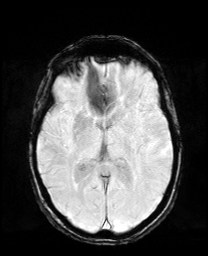
[im 56/56]
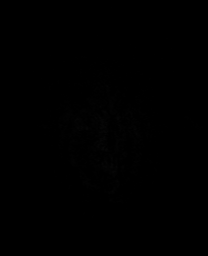

[Series 13: pha_images · axial · 3.0mm · 0.94mm/px · z∈[-88,+69]mm · 3 of 55 slices shown]
[im 1/55]
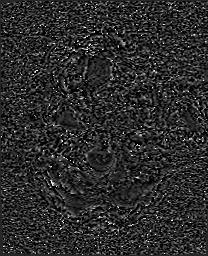
[im 28/55]
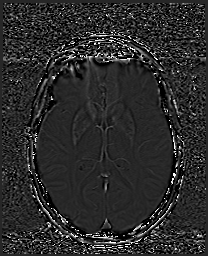
[im 55/55]
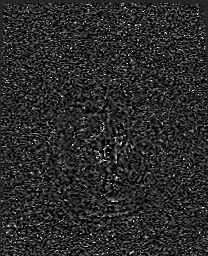

[Series 14: swi_images · axial · 3.0mm · 0.94mm/px · z∈[-88,+72]mm · 3 of 56 slices shown]
[im 1/56]
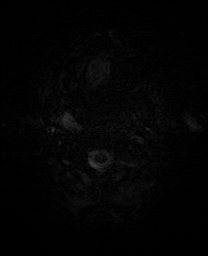
[im 28/56]
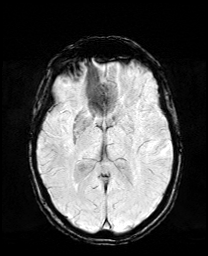
[im 56/56]
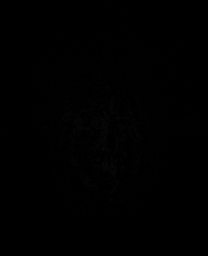

[Series 15: mip_images(sw) · axial · 24.0mm · 0.94mm/px · z∈[-77,+62]mm · 3 of 49 slices shown]
[im 1/49]
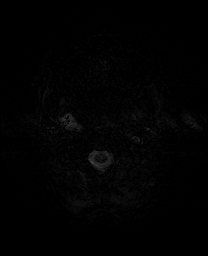
[im 25/49]
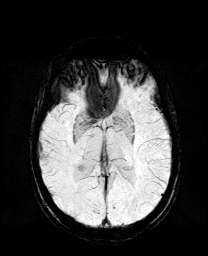
[im 49/49]
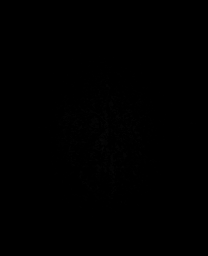

[Series 17: T2 post-contrast · coronal · 5.0mm · 0.72mm/px · 2 of 34 slices shown]
[im 1/34]
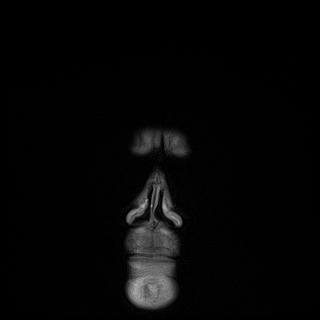
[im 34/34]
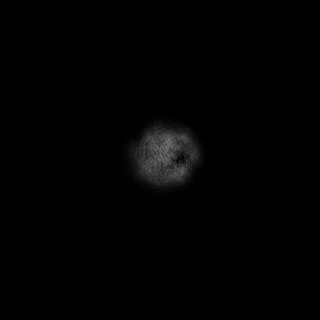

[Series 20: T1 post-contrast · sagittal · 5.0mm · 0.72mm/px · 2 of 27 slices shown (1 of 2)]
[im 1/27]
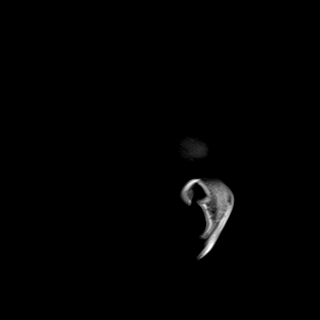
[im 27/27]
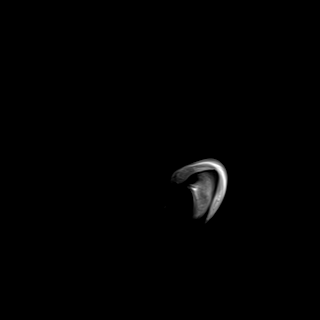

[Series 21: T1 post-contrast · coronal · 5.0mm · 0.34mm/px · 2 of 34 slices shown (2 of 2)]
[im 1/34]
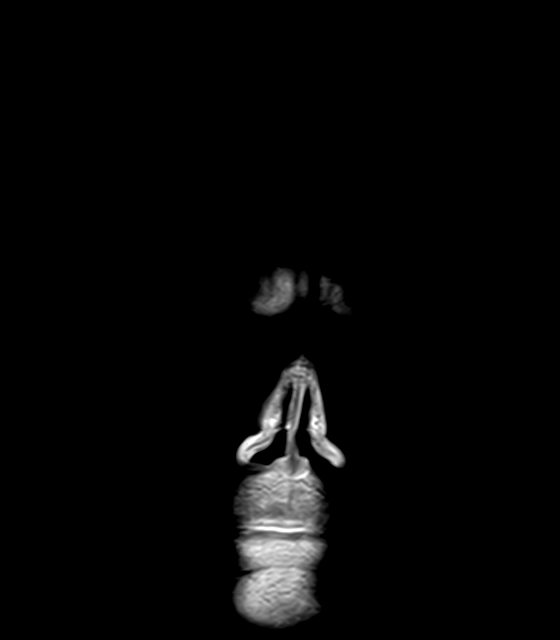
[im 34/34]
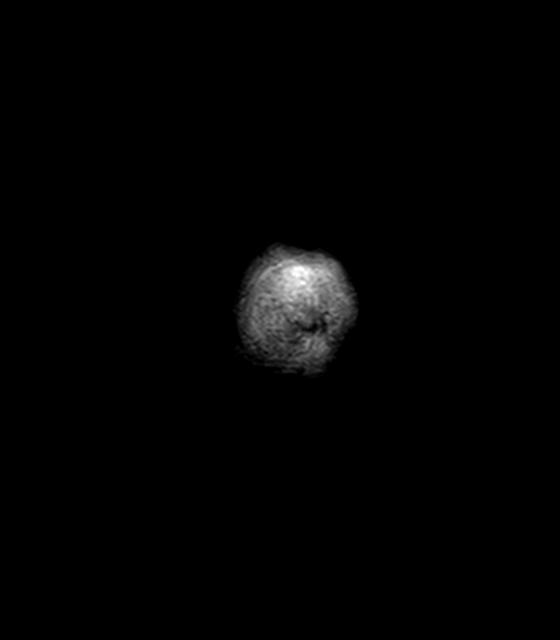

[42 of 48 positions shown; findings below may reference images not displayed]

FINDINGS: Brain: Cerebral volume within normal limits. No evidence for acute
or subacute infarct. No acute or chronic intracranial hemorrhage.

Intraparenchymal mass positioned along the gray-white matter
junction at the posterior left frontoparietal region measures 1.9 x
1.5 x 1.9 cm (series 18, image 37). Surrounding T2/FLAIR signal
intensity consistent with vasogenic edema. No midline shift. Given
the findings on prior CTs, finding is most concerning for a solitary
intracranial metastasis. No other mass lesion, abnormal enhancement,
or visible intracranial metastatic disease. Ventricles normal size
without hydrocephalus. No extra-axial fluid collection. Pituitary
gland suprasellar region within normal limits.

Vascular: Major intracranial vascular flow voids are maintained.

Skull and upper cervical spine: Craniocervical junction within
normal limits. Few scattered probable benign hemangiomata noted
within the visualized upper cervical spine and about the skull base.
No worrisome focal marrow replacing lesion. No scalp soft tissue
abnormality.

Sinuses/Orbits: Patient status post bilateral ocular lens
replacement. Globes and orbital soft tissues demonstrate no acute
finding. Mild mucosal thickening noted within the ethmoidal air
cells and maxillary sinuses. Trace bilateral mastoid effusions
noted, of doubtful significance. Inner ear structures grossly
normal.

Other: 2.1 x 2.3 cm heterogeneous mass noted within the right
parapharyngeal space, better evaluated on prior neck CT.
IMPRESSION: 1. 1.9 x 1.5 x 1.9 cm intraparenchymal mass involving the posterior
left frontoparietal region with surrounding vasogenic edema. Given
the findings on prior CTs, finding is most concerning for a solitary
intracranial metastasis.
2. No other acute intracranial process.
3. 2.1 x 2.3 cm heterogeneous mass within the right parapharyngeal
space, better evaluated on prior neck CT.

## 2021-04-27 IMAGING — MR MR HEAD WO/W CM
10 of 14 series · 22 of 48 positions shown · IV contrast (gadavist)
Comparison: MRI from the same day.

CLINICAL DATA: Metastatic disease evaluation Treatment planning for
possible SRS

EXAM:
MRI HEAD WITHOUT AND WITH CONTRAST
TECHNIQUE: Multiplanar, multiecho pulse sequences of the brain and surrounding
structures were obtained without and with intravenous contrast.
CONTRAST:  5.5mL GADAVIST GADOBUTROL 1 MMOL/ML IV SOLN

[Series 3: FLAIR · sagittal · 3.0mm · 0.47mm/px · 1 of 41 slices shown (1 of 2)]
[im 1/41]
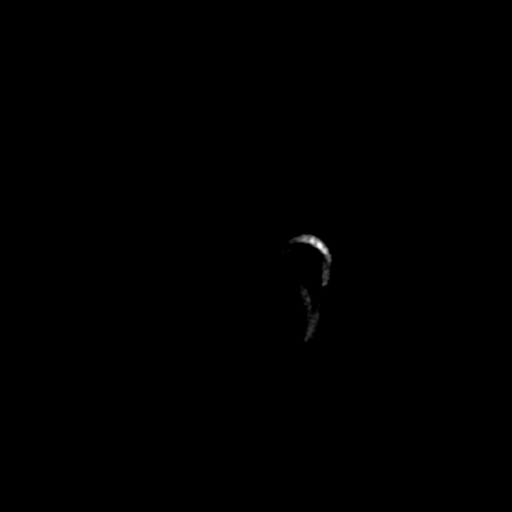

[Series 4: DWI · axial · 3.0mm · 0.94mm/px · z∈[-110,+67]mm · 3 of 124 slices shown]
[im 1/124]
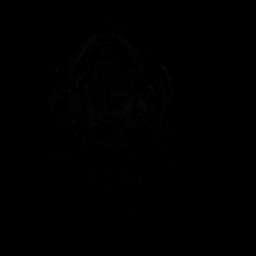
[im 62/124]
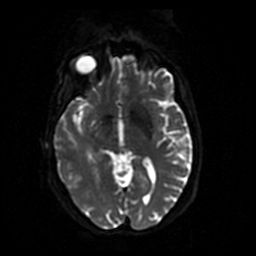
[im 124/124]
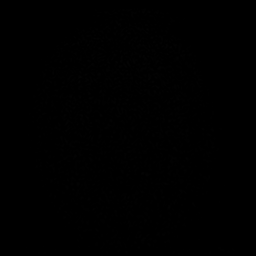

[Series 5: FLAIR · axial · 3.0mm · 0.47mm/px · z∈[-120,+59]mm · 2 of 64 slices shown (2 of 2)]
[im 1/64]
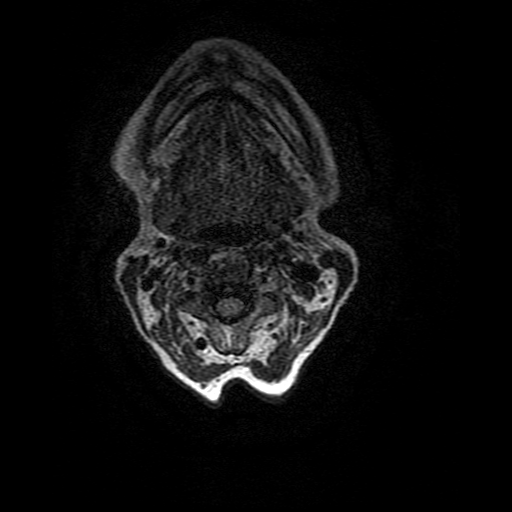
[im 64/64]
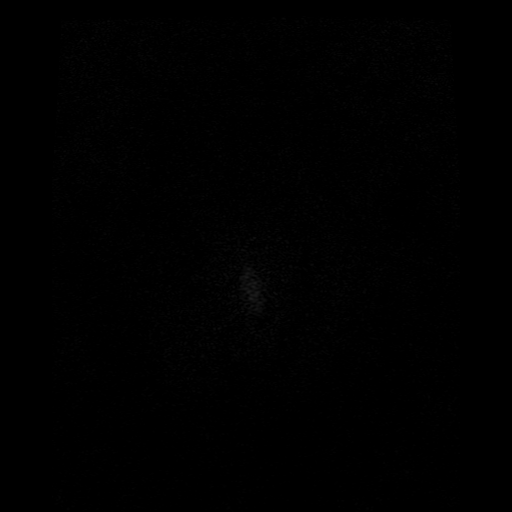

[Series 6: SWI · axial · 3.0mm · 0.47mm/px · z∈[-98,+65]mm · 3 of 116 slices shown]
[im 1/116]
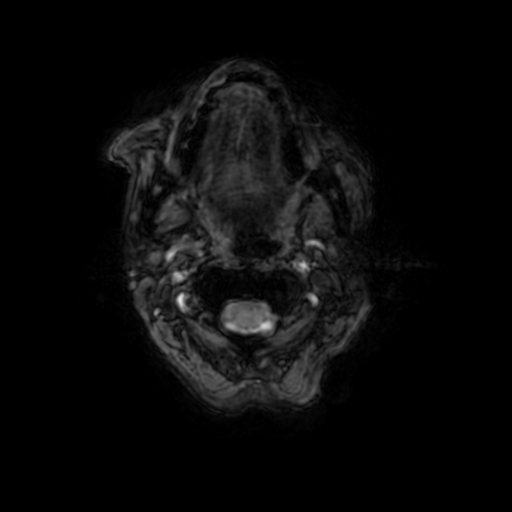
[im 58/116]
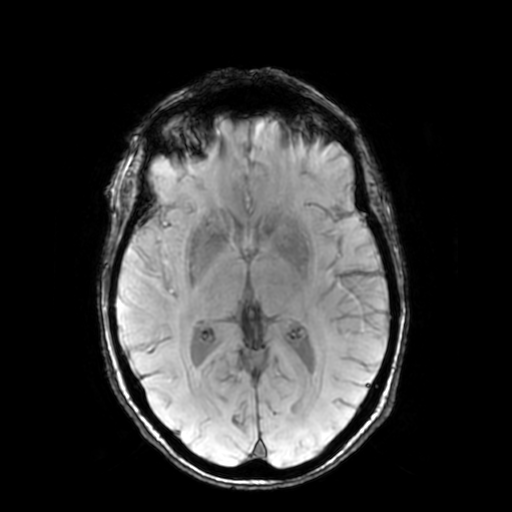
[im 116/116]
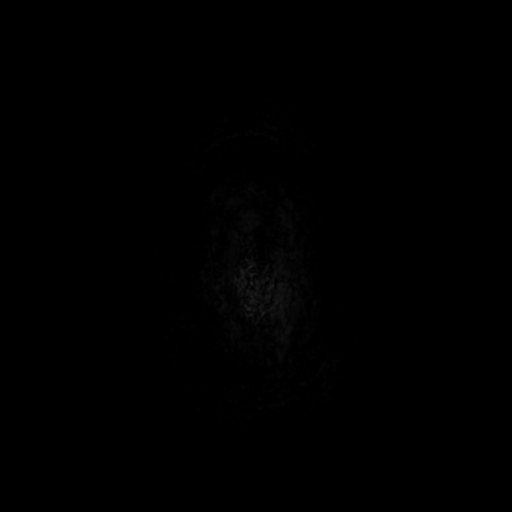

[Series 9: T2 post-contrast · coronal · 3.0mm · 0.39mm/px · 1 of 56 slices shown (1 of 2)]
[im 1/56]
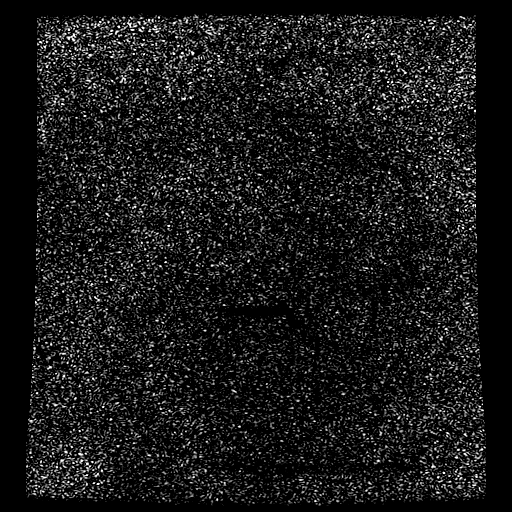

[Series 10: T2 post-contrast · axial · 5.0mm · 0.47mm/px · 1 of 31 slices shown (2 of 2)]
[im 1/31]
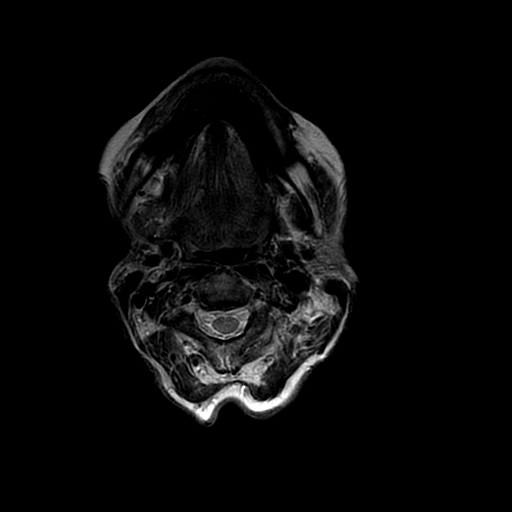

[Series 11: T1 post-contrast · coronal · 3.0mm · 0.39mm/px · 1 of 56 slices shown (1 of 2)]
[im 1/56]
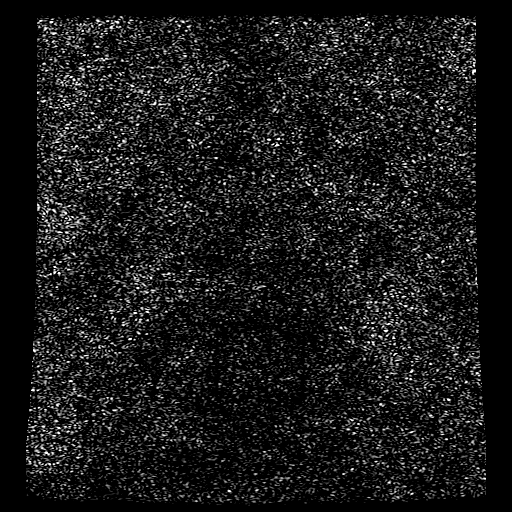

[Series 12: FLAIR post-contrast · sagittal · 3.0mm · 0.47mm/px · 1 of 41 slices shown]
[im 1/41]
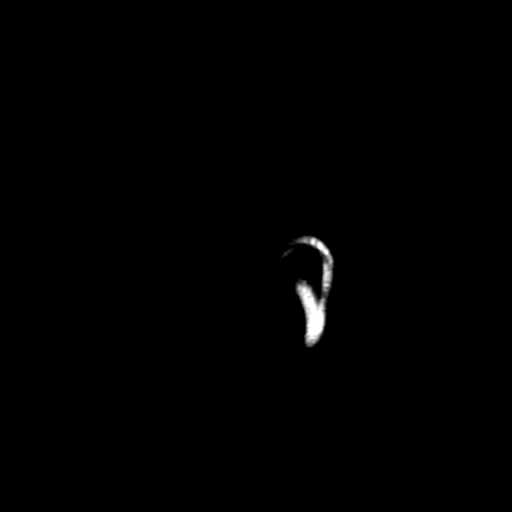

[Series 450: ADC · axial · 3.0mm · 0.94mm/px · z∈[-110,+67]mm · 2 of 62 slices shown]
[im 1/62]
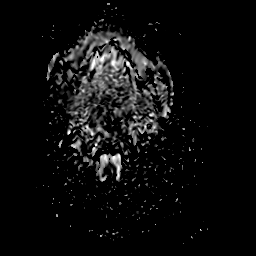
[im 62/62]
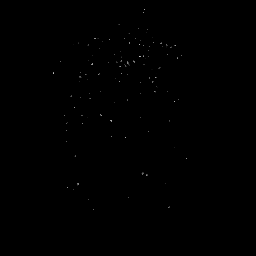

[Series 1300: T1 post-contrast · axial · 0.9mm · 0.50mm/px · z∈[-152,+97]mm · 7 of 283 slices shown (2 of 2)]
[im 1/283]
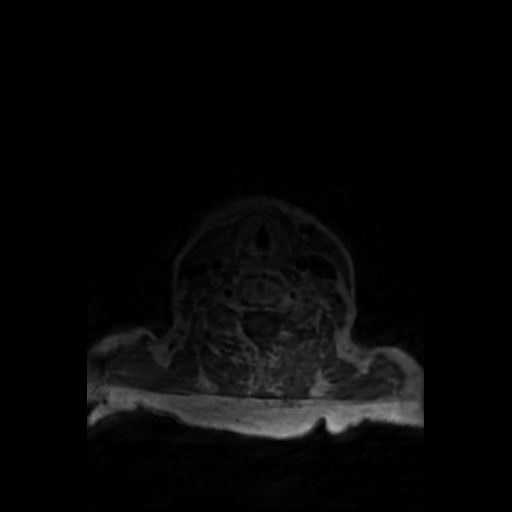
[im 48/283]
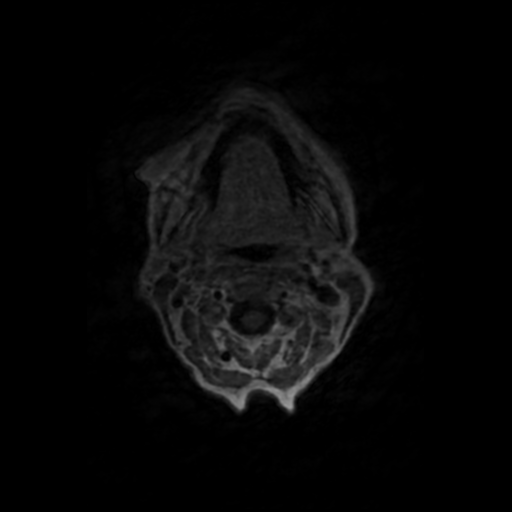
[im 95/283]
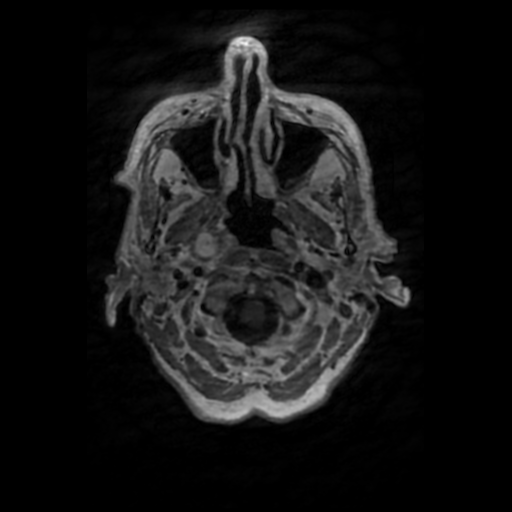
[im 142/283]
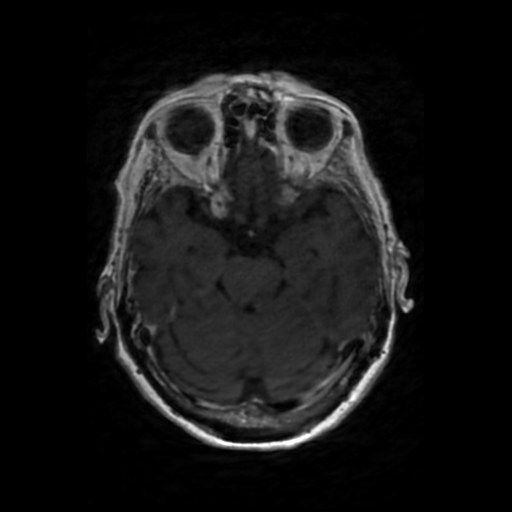
[im 189/283]
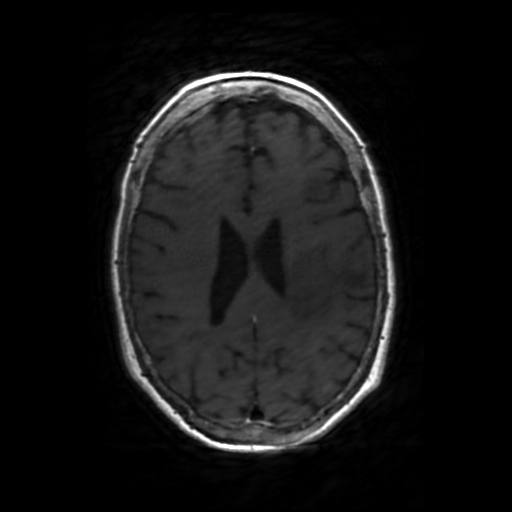
[im 236/283]
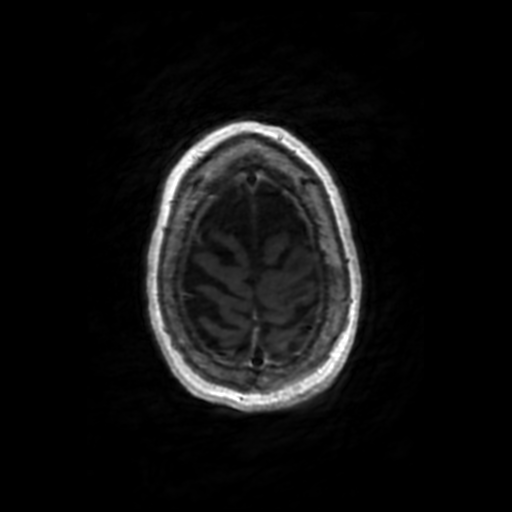
[im 283/283]
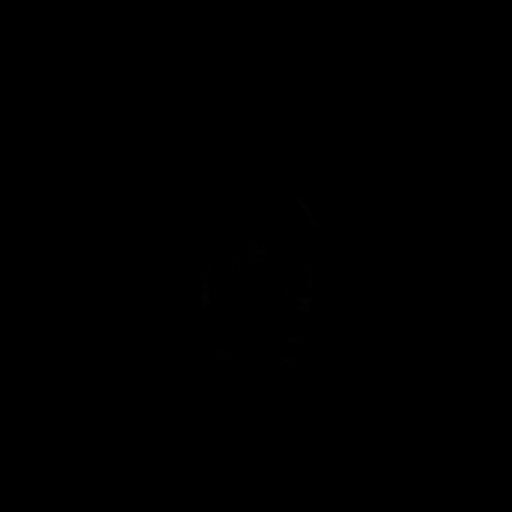

[22 of 48 positions shown; findings below may reference images not displayed]

FINDINGS: Brain: Unchanged 1.9 cm enhancing lesion within the left
frontoparietal region, characterized on same day MRI. Similar
surrounding edema without midline shift. No other enhancing
intraparenchymal lesions identified. No evidence of hydrocephalus,
acute hemorrhage, acute infarct, or extra-axial fluid collection.
Right parapharyngeal space mass is T2 hyperintense and
heterogeneously enhancing, further characterized on same day CT.

Vascular: Major arterial flow voids are maintained at the skull
base.

Skull and upper cervical spine: Similar probable benign vertebral
venous malformations in the upper cervical spine, which are T1
hyperintense.

Sinuses/Orbits: Mild paranasal sinus mucosal thickening.
Unremarkable orbits.

Other: No sizable mastoid effusions.
IMPRESSION: 1. Redemonstrated 1.9 cm enhancing intraparenchymal mass in the
posterior left frontoparietal region with surrounding vasogenic
edema, concerning for an intracranial metastasis given findings on
recent CT chest.
2. Partially imaged right parapharyngeal neck mass, further
characterized on same day CT neck. This could represent a nodal
metastasis or primary malignancy such as a salivary gland neoplasm
or neurogenic tumor.

## 2021-04-27 MED ORDER — GADOBUTROL 1 MMOL/ML IV SOLN
5.0000 mL | Freq: Once | INTRAVENOUS | Status: AC | PRN
  Administered 2021-04-27: 5 mL via INTRAVENOUS

## 2021-04-27 MED ORDER — ACETAMINOPHEN 325 MG PO TABS: 650.0000 mg | ORAL_TABLET | Freq: Four times a day (QID) | ORAL | Status: DC | PRN

## 2021-04-27 MED ORDER — GUAIFENESIN-DM 100-10 MG/5ML PO SYRP
10.0000 mL | ORAL_SOLUTION | ORAL | Status: DC | PRN
  Administered 2021-04-27: 10 mL via ORAL
  Filled 2021-04-27: qty 10

## 2021-04-27 MED ORDER — ACETAMINOPHEN 650 MG RE SUPP: 650.0000 mg | Freq: Four times a day (QID) | RECTAL | Status: DC | PRN

## 2021-04-27 MED ORDER — INFLUENZA VAC A&B SA ADJ QUAD 0.5 ML IM PRSY
0.5000 mL | PREFILLED_SYRINGE | INTRAMUSCULAR | Status: AC
  Administered 2021-04-28: 0.5 mL via INTRAMUSCULAR
  Filled 2021-04-27: qty 0.5

## 2021-04-27 MED ORDER — GADOBUTROL 1 MMOL/ML IV SOLN
5.5000 mL | Freq: Once | INTRAVENOUS | Status: AC | PRN
  Administered 2021-04-27: 5.5 mL via INTRAVENOUS

## 2021-04-27 NOTE — Consult Note (Signed)
Reason for Consult:brain met Referring Physician: edp  Cynthia Gonzalez is an 71 y.o. female.   HPI:  71 year old patient comes in last night with progressive right hand weakness over the last 3 weeks.  Denies any headaches nausea vomiting dizziness or vision changes.  She has had no right lower extremity weakness.  Has a history of smoking off and on since she was in her 47s.  No numbness or tingling in her right arm  Past Medical History:  Diagnosis Date   Allergy    Blood transfusion without reported diagnosis 1976   Hyperlipidemia     Past Surgical History:  Procedure Laterality Date   DENTAL SURGERY     with sedative per pt, not sedated   TUBAL LIGATION  1983   VAGINAL DELIVERY     x7-sedation with 1 delivery 1973    No Known Allergies  Social History   Tobacco Use   Smoking status: Every Day    Packs/day: 0.25    Years: 40.00    Pack years: 10.00    Types: Cigarettes   Smokeless tobacco: Never  Substance Use Topics   Alcohol use: Yes    Comment: occ    Family History  Problem Relation Age of Onset   Cirrhosis Mother    Cancer Father    Colon cancer Neg Hx    Rectal cancer Neg Hx    Stomach cancer Neg Hx      Review of Systems  Positive ROS: As above  All other systems have been reviewed and were otherwise negative with the exception of those mentioned in the HPI and as above.  Objective: Vital signs in last 24 hours: Temp:  [97.5 F (36.4 C)-98.7 F (37.1 C)] 97.5 F (36.4 C) (01/05 0801) Pulse Rate:  [68-121] 76 (01/05 0801) Resp:  [12-24] 17 (01/05 0801) BP: (117-165)/(53-92) 138/72 (01/05 0801) SpO2:  [93 %-99 %] 97 % (01/05 0801) Weight:  [53.5 kg] 53.5 kg (01/04 1348)  General Appearance: Alert, cooperative, no distress, appears stated age Head: Normocephalic, without obvious abnormality, atraumatic Eyes: PERRL, conjunctiva/corneas clear, EOM's intact, fundi benign, both eyes      Lungs: respirations unlabored Heart: Regular rate and  rhythm Extremities: Extremities normal, atraumatic, no cyanosis or edema Pulses: 2+ and symmetric all extremities Skin: Skin color, texture, turgor normal, no rashes or lesions  NEUROLOGIC:   Mental status: A&O x4, no aphasia, good attention span, Memory and fund of knowledge Motor Exam - grossly normal bilateral lower extremities and left upper extremity, right wrist extension 3 out of 5, right finger extension 2 out of 5, right hand grip 2 out of 5 Sensory Exam - grossly normal Reflexes: symmetric, no pathologic reflexes, No Hoffman's, No clonus Coordination - grossly normal Gait - grossly normal Balance - grossly normal Cranial Nerves: I: smell Not tested  II: visual acuity  OS: na    OD: na  II: visual fields Full to confrontation  II: pupils Equal, round, reactive to light  III,VII: ptosis None  III,IV,VI: extraocular muscles  Full ROM  V: mastication   V: facial light touch sensation    V,VII: corneal reflex    VII: facial muscle function - upper    VII: facial muscle function - lower   VIII: hearing   IX: soft palate elevation    IX,X: gag reflex   XI: trapezius strength    XI: sternocleidomastoid strength   XI: neck flexion strength    XII: tongue strength  Data Review Lab Results  Component Value Date   WBC 3.3 (L) 04/27/2021   HGB 13.1 04/27/2021   HCT 38.7 04/27/2021   MCV 87.8 04/27/2021   PLT 418 (H) 04/27/2021   Lab Results  Component Value Date   NA 135 04/27/2021   K 4.2 04/27/2021   CL 105 04/27/2021   CO2 21 (L) 04/27/2021   BUN 9 04/27/2021   CREATININE 0.68 04/27/2021   GLUCOSE 171 (H) 04/27/2021   Lab Results  Component Value Date   INR 0.9 04/26/2021    Radiology: CT HEAD WO CONTRAST  Result Date: 04/26/2021 CLINICAL DATA:  Right-sided weakness, stroke suspected EXAM: CT HEAD WITHOUT CONTRAST TECHNIQUE: Contiguous axial images were obtained from the base of the skull through the vertex without intravenous contrast. COMPARISON:   None. FINDINGS: Brain: There is an approximately 1.7 cm intra-axial mass lesion in the posterior left frontal lobe with moderate surrounding vasogenic edema and minimal associated sulcal effacement. No additional definite brain lesions or areas of vasogenic edema visualized. No acute intracranial hemorrhage. No hydrocephalus. No extra-axial fluid collections or herniation. Vascular: Calcified plaques in the carotid siphons. Skull: Normal. Negative for fracture or focal lesion. Sinuses/Orbits: Mild chronic mucosal thickening in the maxillary sinuses. No acute process identified. Other: Partially visualized 2 x 1.9 cm rounded hypodense mass in the right neck parapharyngeal space at the level of the nasopharynx. IMPRESSION: 1. Approximally 1.7 cm left frontal lobe mass with moderate surrounding vasogenic edema and minimal mass effect. May represent metastatic or primary lesion. MRI brain with contrast recommended as well as oncologic workup as indicated. 2. Partially visualized hypodense mass in the right neck parapharyngeal space. Indeterminate but could also represent metastasis given the brain findings. Could also be evaluated further with CT neck with contrast. Electronically Signed   By: Ofilia Neas M.D.   On: 04/26/2021 15:57   CT Soft Tissue Neck W Contrast  Result Date: 04/26/2021 CLINICAL DATA:  Right parapharyngeal mass noted on head CT EXAM: CT NECK WITH CONTRAST TECHNIQUE: Multidetector CT imaging of the neck was performed using the standard protocol following the bolus administration of intravenous contrast. CONTRAST:  159m OMNIPAQUE IOHEXOL 300 MG/ML  SOLN COMPARISON:  No prior neck CT, correlation is made with 04/26/2021 CT head FINDINGS: Pharynx and larynx: Normal. No mass or swelling. Salivary glands: No inflammation, mass, or stone. Thyroid: Subcentimeter lesion in the left thyroid lobe (series 3, image 80). Otherwise negative. Lymph nodes: Heterogeneously enhancing, centrally hypodense mass  in the right parapharyngeal space, favored to represent an enlarged right level 2A lymph node, measuring up to 2.2 x 2.8 x 3.1 cm (AP x TR x CC) (series 3, image 37 and series 7, image 63). No other enlarged or abnormal appearing lymph nodes. Vascular: Negative. Limited intracranial: Negative. Previously noted left frontal lobe mass is not included in the field of view. Visualized orbits: Status post bilateral lens replacements. Otherwise negative. Mastoids and visualized paranasal sinuses: Mucosal thickening in the bilateral maxillary sinuses. Otherwise negative. Skeleton: Degenerative changes in the cervical spine. No acute osseous abnormality. Upper chest: Please see same-day CT chest Other: None. IMPRESSION: 1. Heterogeneously enhancing, centrally hypodense mass in the right parapharyngeal space, favored to represent an enlarged, necrotic right level 2A lymph node, concerning for metastatic disease. Biopsy is recommended. 2. No other acute finding in the neck. Electronically Signed   By: AMerilyn BabaM.D.   On: 04/26/2021 18:48   MR Brain W and Wo Contrast  Result Date: 04/27/2021 CLINICAL  DATA:  Initial evaluation for brain CNS neoplasm. EXAM: MRI HEAD WITHOUT AND WITH CONTRAST TECHNIQUE: Multiplanar, multiecho pulse sequences of the brain and surrounding structures were obtained without and with intravenous contrast. CONTRAST:  51m GADAVIST GADOBUTROL 1 MMOL/ML IV SOLN COMPARISON:  Multiple previous CTs from 04/26/2021. FINDINGS: Brain: Cerebral volume within normal limits. No evidence for acute or subacute infarct. No acute or chronic intracranial hemorrhage. Intraparenchymal mass positioned along the gray-white matter junction at the posterior left frontoparietal region measures 1.9 x 1.5 x 1.9 cm (series 18, image 37). Surrounding T2/FLAIR signal intensity consistent with vasogenic edema. No midline shift. Given the findings on prior CTs, finding is most concerning for a solitary intracranial  metastasis. No other mass lesion, abnormal enhancement, or visible intracranial metastatic disease. Ventricles normal size without hydrocephalus. No extra-axial fluid collection. Pituitary gland suprasellar region within normal limits. Vascular: Major intracranial vascular flow voids are maintained. Skull and upper cervical spine: Craniocervical junction within normal limits. Few scattered probable benign hemangiomata noted within the visualized upper cervical spine and about the skull base. No worrisome focal marrow replacing lesion. No scalp soft tissue abnormality. Sinuses/Orbits: Patient status post bilateral ocular lens replacement. Globes and orbital soft tissues demonstrate no acute finding. Mild mucosal thickening noted within the ethmoidal air cells and maxillary sinuses. Trace bilateral mastoid effusions noted, of doubtful significance. Inner ear structures grossly normal. Other: 2.1 x 2.3 cm heterogeneous mass noted within the right parapharyngeal space, better evaluated on prior neck CT. IMPRESSION: 1. 1.9 x 1.5 x 1.9 cm intraparenchymal mass involving the posterior left frontoparietal region with surrounding vasogenic edema. 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 2. No other acute intracranial process. 3. 2.1 x 2.3 cm heterogeneous mass within the right parapharyngeal space, better evaluated on prior neck CT. Electronically Signed   By: BJeannine BogaM.D.   On: 04/27/2021 05:05   CT CHEST ABDOMEN PELVIS W CONTRAST  Result Date: 04/26/2021 CLINICAL DATA:  Right parapharyngeal mass, left temporal lobe mass, concern for metastatic disease EXAM: CT CHEST, ABDOMEN, AND PELVIS WITH CONTRAST TECHNIQUE: Multidetector CT imaging of the chest, abdomen and pelvis was performed following the standard protocol during bolus administration of intravenous contrast. CONTRAST:  1028mOMNIPAQUE IOHEXOL 300 MG/ML  SOLN COMPARISON:  None. FINDINGS: CT  CHEST FINDINGS Cardiovascular: The heart is unremarkable without pericardial effusion. While this examination is not optimized for evaluation of the pulmonary vasculature, I do not see any filling defects or pulmonary emboli. No evidence of thoracic aortic aneurysm or dissection. Moderate atherosclerosis throughout the aorta and coronary vasculature. Mediastinum/Nodes: Pathologic adenopathy within the mediastinum and hila. Precarinal lymph node measures up to 15 mm in short axis, reference image 29/3. Thyroid, trachea, and esophagus are unremarkable. Lungs/Pleura: There is a spiculated left upper lobe mass measuring 4.3 x 3.9 x 3.0 cm, consistent with primary lung cancer. Right hilar mass versus adenopathy identified, reference image 74/4, measuring 1.6 x 1.2 by 1.9 cm. This results in partial obstruction of right upper lobe segmental bronchi. No airspace disease, effusion, or pneumothorax. Musculoskeletal: There are no acute or destructive bony lesions. Reconstructed images demonstrate no additional findings. CT ABDOMEN PELVIS FINDINGS Hepatobiliary: Small gallstones without cholecystitis. The liver is unremarkable. Pancreas: Unremarkable. No pancreatic ductal dilatation or surrounding inflammatory changes. Spleen: Normal in size without focal abnormality. Adrenals/Urinary Tract: Thickening of the medial limb of the left adrenal gland measuring up to 8 mm. No discrete right adrenal findings. Metastatic disease is not  excluded. The kidneys enhance normally and symmetrically. Bladder is markedly distended, without filling defect. Stomach/Bowel: Nonspecific wall thickening of the gastric cardia measuring up to 2.1 cm. No bowel obstruction or ileus. Vascular/Lymphatic: 3.7 cm infrarenal abdominal aortic aneurysm, with extensive mural thrombus. Diffuse atherosclerosis. 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 No pathologic adenopathy  within the abdomen or pelvis. Reproductive: Uterus and bilateral adnexa are unremarkable. Other: No free fluid or free gas.  No abdominal wall hernia. Musculoskeletal: No acute or destructive bony lesions. Reconstructed images demonstrate no additional findings. IMPRESSION: 1. Large spiculated left upper lobe mass consistent with primary lung cancer. 2. Right hilar mass versus adenopathy, with partial obstruction of segmental right upper lobe bronchi. 3. Mediastinal and hilar adenopathy consistent with nodal metastases. 4. Nonspecific left adrenal thickening, metastatic disease not excluded. 5. Nonspecific wall thickening of the gastric cardia. 6. 3.7 cm infrarenal abdominal aortic aneurysm. Recommend follow-up every 2 years. Reference: J Am Coll Radiol 7981;02:548-628. 7. 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 The bilateral profundus femoral arteries remain patent. 8. Cholelithiasis without cholecystitis. 9.  Aortic Atherosclerosis (ICD10-I70.0). Electronically Signed   By: Randa Ngo M.D.   On: 04/26/2021 18:52     Assessment/Plan: 71 year old patient resented to the ED last night with a right progressive hand weakness she was transferred from [facility] last night.  After pan scanning her she was found to have a large lung mass, and parapharyngeal mass.  Her MRI head shows a left frontoparietal mass with vasogenic edema along her motor strip.  Given the fact that this is right along her motor strip we would recommend SRS without surgical resection.  We will present her case to the tumor board as well.  I have discussed this plan of care with the patient and with Dr. Saintclair Halsted.   Ocie Cornfield Aubery Date 04/27/2021 8:29 AM

## 2021-04-27 NOTE — Progress Notes (Signed)
°  Transition of Care [facility]) Screening Note   Patient Details  Name: Cynthia Gonzalez Date of Birth: 1951/01/08   Transition of Care [facility]) CM/SW Contact:    Pollie Friar, RN Phone Number: 04/27/2021, 1:53 PM    Transition of Care Department [facility]) has reviewed patient and no TOC needs have been identified at this time. We will continue to monitor patient advancement through interdisciplinary progression rounds. If new patient transition needs arise, please place a TOC consult.

## 2021-04-27 NOTE — Progress Notes (Signed)
PCCM Brief Progress Note  # LUL mass # mediastinal LAD - diagnostic ebus under general tomorrow afternoon - hold prophylactic AC - NPO after midnight  [place]

## 2021-04-27 NOTE — Progress Notes (Signed)
Radiation Oncology         [phone] ________________________________  Name: Cynthia Gonzalez MRN: [id number]  Date: 04/26/2021  DOB: [birth year]  Chart Note:  Notified about this admission by Dr. Burr Medico and reviewed chart/imaging.  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    Agree with pulmonary recommendation for Bronch/EBUS tomorrow.  Agree with neurosurgery for SRS treatment of the solitary brain met, pending biopsy results.  If patient's presenting neuro deficits improve with decadron, would support hospital discharge after bronch with rapid outpatient follow-up to complete [facility] treatment and PET staging as outpatient.  ________________________________  Sheral Apley. Tammi Klippel, M.D.

## 2021-04-27 NOTE — Consult Note (Addendum)
NAME:  Cynthia Gonzalez, MRN:  [id number], DOB:  1951-01-04, LOS: 1 ADMISSION DATE:  04/26/2021, CONSULTATION DATE: 04/27/2020 REFERRING MD: [facility], CHIEF COMPLAINT: Right hand weakness  History of Present Illness:  71 year old female he started smoking age 23 one half pack a day and is essentially healthy.  She notes 2 to 3 weeks of increasing weakness on her right hand.  She is evaluated emergency department and found to have a 1.7 cm left frontal lobe mass with vasogenic edema.  She also noted to have CT of the chest which shows a left upper lobe spiculated mass and soft tissues CT of neck which shows mass in the right parapharyngeal space suspicious for metastatic disease.  IR was asked to do tissue biopsy but felt it would be more advantageous for pulmonary to do it via fiberoptic bronchoscopy.  She has been evaluated by pulmonary and has been set up for fiberoptic bronchoscopy in the near future with a goal of tissue biopsy.  [facility]  Pertinent  Medical History   Past Medical History:  Diagnosis Date   Allergy    Blood transfusion without reported diagnosis 1976   Hyperlipidemia      Significant Hospital Events: Including procedures, antibiotic start and stop dates in addition to other pertinent events   04/27/2021 pulmonary consult  Interim History / Subjective:  71 year old female with finding of head neck and lung cancer  Objective   Blood pressure 138/72, pulse 76, temperature (!) 97.5 F (36.4 C), temperature source Oral, resp. rate 17, height 5' 5"  (1.651 m), weight 53.5 kg, SpO2 97 %.       No intake or output data in the 24 hours ending 04/27/21 1058 Filed Weights   04/26/21 1348  Weight: 53.5 kg    Examination: General: Well-nourished well-developed female no acute distress HENT: No JVD is appreciated Lungs: Lungs are clear to auscultation Cardiovascular: Heart sounds are regular regular rate rhythm    Abdomen: Abdomen soft nontender Extremities: Warm without  edema Neuro: Grossly intact but with right arm weakness that is improving on her fourth dose of steroid GU: Voids  Resolved Hospital Problem list     Assessment & Plan:  New diagnosis of lung cancer with metastasis to brain and also with a right parapharyngeal space suspicious for cancer.  71 year old whose only symptom was right arm weakness presented on 04/26/2020 CT of the head revealed mass along with left long and right para pharyngeal mass suspicious for cancer.  She was evaluated by neurosurgery with no surgical surgical interventions required she is also evaluated by interventional radiology for questionable biopsy of the mass pulmonary to evaluate her for fiberoptic bronchoscopy for tissue biopsy.  Pulmonary evaluation for possible fiberoptic bronchoscopy with tissue biopsy Radiation and medical oncology once tissue biopsy is obtained.   Best Practice (right click and "Reselect all SmartList Selections" daily)   Per primary  Labs   CBC: Recent Labs  Lab 04/26/21 1608 04/27/21 0220  WBC 6.9 3.3*  NEUTROABS 4.4  --   HGB 14.0 13.1  HCT 41.6 38.7  MCV 88.1 87.8  PLT 461* 418*    Basic Metabolic Panel: Recent Labs  Lab 04/26/21 1608 04/27/21 0220  NA 135 135  K 3.8 4.2  CL 101 105  CO2 24 21*  GLUCOSE 78 171*  BUN 10 9  CREATININE 0.79 0.68  CALCIUM 9.0 9.5   GFR: Estimated Creatinine Clearance: 53.7 mL/min (by C-G formula based on SCr of 0.68 mg/dL). Recent Labs  Lab  04/26/21 1608 04/27/21 0220  WBC 6.9 3.3*    Liver Function Tests: Recent Labs  Lab 04/26/21 1608 04/27/21 0220  AST 12* 12*  ALT 7 8  ALKPHOS 102 105  BILITOT 0.3 0.4  PROT 7.6 6.7  ALBUMIN 3.7 3.3*   No results for input(s): LIPASE, AMYLASE in the last 168 hours. No results for input(s): AMMONIA in the last 168 hours.  ABG No results found for: PHART, PCO2ART, PO2ART, HCO3, TCO2, ACIDBASEDEF, O2SAT   Coagulation Profile: Recent Labs  Lab 04/26/21 1608  INR 0.9     Cardiac Enzymes: No results for input(s): CKTOTAL, CKMB, CKMBINDEX, TROPONINI in the last 168 hours.  HbA1C: No results found for: HGBA1C  CBG: No results for input(s): GLUCAP in the last 168 hours.  Review of Systems:   10 point review of system taken, please see HPI for positives and negatives.   Past Medical History:  She,  has a past medical history of Allergy, Blood transfusion without reported diagnosis (1976), and Hyperlipidemia.   Surgical History:   Past Surgical History:  Procedure Laterality Date   DENTAL SURGERY     with sedative per pt, not sedated   Early     x7-sedation with 1 delivery 1973     Social History:   reports that she has been smoking cigarettes. She has a 10.00 pack-year smoking history. She has never used smokeless tobacco. She reports current alcohol use. She reports that she does not use drugs.   Family History:  Her family history includes Cancer in her father; Cirrhosis in her mother. There is no history of Colon cancer, Rectal cancer, or Stomach cancer.   Allergies No Known Allergies   Home Medications  Prior to Admission medications   Medication Sig Start Date End Date Taking? Authorizing Provider  Calcium Carbonate (CALCIUM 600 PO) Take 600 mg by mouth daily.    [provider]  Cholecalciferol (VITAMIN D3) 2000 UNITS TABS Take 2,000 Units by mouth daily.    [provider]  Multiple Vitamin (MULTIVITAMIN) tablet Take 1 tablet by mouth daily.    [provider]  Na Sulfate-K Sulfate-Mg Sulf (SUPREP BOWEL PREP) SOLN Take 1 kit by mouth once. suprep as directed. No substitutions 08/20/14   Lafayette Dragon, MD  simvastatin (ZOCOR) 20 MG tablet Take 20 mg by mouth daily at 6 PM.    [provider]     Critical care time: Ferol Luz Naleigha Raimondi ACNP Acute Care Nurse Practitioner [place] Please consult Amion 04/27/2021, 10:58 AM

## 2021-04-27 NOTE — H&P (Signed)
History and Physical    Cynthia Gonzalez:[id number] DOB: [birth year] DOA: 04/26/2021  PCP: Nolene Ebbs, MD  Patient coming from: Home.  Chief Complaint: Right upper extremity weakness.  HPI: Cynthia Gonzalez is a 71 y.o. female with history of tobacco abuse presents to the ER after patient had been having persistent weakness of the right upper extremity for the last 3 weeks.  Denies any weakness of the lower extremities and in color urine bowel or difficulty speaking or swallowing.  ED Course: In the ER patient had a CT head which shows 1.7 cm left frontal lobe mass with moderate vasogenic edema.  Patient had also mass in the right parapharyngeal area concerning for metastatic disease.  CT chest abdomen pelvis was done which shows large spiculated left upper lobe mass concerning for malignancy.  Also CT scan showed hilar mediastinal adenopathy and also lesions in the adrenal gland concerning for metastatic disease.  On-call neurosurgery was consulted.  Patient was started on Decadron for the vasogenic edema with brain mets admitted for further work-up.  COVID test was negative.   Review of Systems: As per HPI, rest all negative.   Past Medical History:  Diagnosis Date   Allergy    Blood transfusion without reported diagnosis 1976   Hyperlipidemia     Past Surgical History:  Procedure Laterality Date   DENTAL SURGERY     with sedative per pt, not sedated   TUBAL LIGATION  1983   VAGINAL DELIVERY     x7-sedation with 1 delivery 1973     reports that she has been smoking cigarettes. She has a 10.00 pack-year smoking history. She has never used smokeless tobacco. She reports current alcohol use. She reports that she does not use drugs.  No Known Allergies  Family History  Problem Relation Age of Onset   Cirrhosis Mother    Cancer Father    Colon cancer Neg Hx    Rectal cancer Neg Hx    Stomach cancer Neg Hx     Prior to Admission medications   Medication Sig Start  Date End Date Taking? Authorizing Provider  Calcium Carbonate (CALCIUM 600 PO) Take 600 mg by mouth daily.    [provider]  Cholecalciferol (VITAMIN D3) 2000 UNITS TABS Take 2,000 Units by mouth daily.    [provider]  Multiple Vitamin (MULTIVITAMIN) tablet Take 1 tablet by mouth daily.    [provider]  Na Sulfate-K Sulfate-Mg Sulf (SUPREP BOWEL PREP) SOLN Take 1 kit by mouth once. suprep as directed. No substitutions 08/20/14   Lafayette Dragon, MD  simvastatin (ZOCOR) 20 MG tablet Take 20 mg by mouth daily at 6 PM.    [provider]    Physical Exam: Constitutional: Moderately built and nourished. Vitals:   04/26/21 2100 04/26/21 2200 04/26/21 2259 04/27/21 0100  BP: 117/81 (!) 146/81 (!) 147/67 (!) 148/78  Pulse: (!) 118 (!) 117 (!) 121 (!) 114  Resp: (!) _0 Temp:   98 F (36.7 C) 98.7 F (37.1 C)  TempSrc:   Oral Oral  SpO2: 96% 99% 96% 99%  Weight:      Height:       Eyes: Anicteric no pallor. ENMT: No discharge from the ears eyes nose and mouth. Neck: No mass felt.  No neck rigidity. Respiratory: No rhonchi or crepitations. Cardiovascular: S1-S2 heard. Abdomen: Soft nontender bowel sound present. Musculoskeletal: No edema. Skin: No rash. Neurologic: Alert awake oriented to time  place and person.  Moves all extremities. Psychiatric: Appears normal.  Normal affect.   Labs on Admission: I have personally reviewed following labs and imaging studies  CBC: Recent Labs  Lab 04/26/21 1608  WBC 6.9  NEUTROABS 4.4  HGB 14.0  HCT 41.6  MCV 88.1  PLT 409*   Basic Metabolic Panel: Recent Labs  Lab 04/26/21 1608  NA 135  K 3.8  CL 101  CO2 24  GLUCOSE 78  BUN 10  CREATININE 0.79  CALCIUM 9.0   GFR: Estimated Creatinine Clearance: 53.7 mL/min (by C-G formula based on SCr of 0.79 mg/dL). Liver Function Tests: Recent Labs  Lab 04/26/21 1608  AST 12*  ALT 7  ALKPHOS 102  BILITOT 0.3  PROT 7.6  ALBUMIN 3.7    No results for input(s): LIPASE, AMYLASE in the last 168 hours. No results for input(s): AMMONIA in the last 168 hours. Coagulation Profile: Recent Labs  Lab 04/26/21 1608  INR 0.9   Cardiac Enzymes: No results for input(s): CKTOTAL, CKMB, CKMBINDEX, TROPONINI in the last 168 hours. BNP (last 3 results) No results for input(s): PROBNP in the last 8760 hours. HbA1C: No results for input(s): HGBA1C in the last 72 hours. CBG: No results for input(s): GLUCAP in the last 168 hours. Lipid Profile: No results for input(s): CHOL, HDL, LDLCALC, TRIG, CHOLHDL, LDLDIRECT in the last 72 hours. Thyroid Function Tests: No results for input(s): TSH, T4TOTAL, FREET4, T3FREE, THYROIDAB in the last 72 hours. Anemia Panel: No results for input(s): VITAMINB12, FOLATE, FERRITIN, TIBC, IRON, RETICCTPCT in the last 72 hours. Urine analysis:    Component Value Date/Time   COLORURINE STRAW (A) 04/26/2021 2252   APPEARANCEUR CLEAR 04/26/2021 2252   LABSPEC 1.025 04/26/2021 2252   PHURINE 7.0 04/26/2021 2252   GLUCOSEU NEGATIVE 04/26/2021 2252   HGBUR NEGATIVE 04/26/2021 2252   BILIRUBINUR NEGATIVE 04/26/2021 2252   KETONESUR NEGATIVE 04/26/2021 2252   PROTEINUR NEGATIVE 04/26/2021 2252   NITRITE NEGATIVE 04/26/2021 2252   LEUKOCYTESUR NEGATIVE 04/26/2021 2252   Sepsis Labs: _0 (procalcitonin:4,lacticidven:4) ) Recent Results (from the past 240 hour(s))  Resp Panel by RT-PCR (Flu A&B, Covid) Nasopharyngeal Swab     Status: None   Collection Time: 04/26/21  4:08 PM   Specimen: Nasopharyngeal Swab; Nasopharyngeal(NP) swabs in vial transport medium  Result Value Ref Range Status   SARS Coronavirus 2 by RT PCR NEGATIVE NEGATIVE Final    Comment: (NOTE) SARS-CoV-2 target nucleic acids are NOT DETECTED.  The SARS-CoV-2 RNA is generally detectable in upper respiratory specimens during the acute phase of infection. The lowest concentration of SARS-CoV-2 viral copies this assay can detect  is 138 copies/mL. A negative result does not preclude SARS-Cov-2 infection and should not be used as the sole basis for treatment or other patient management decisions. A negative result may occur with  improper specimen collection/handling, submission of specimen other than nasopharyngeal swab, presence of viral mutation(s) within the areas targeted by this assay, and inadequate number of viral copies(<138 copies/mL). A negative result must be combined with clinical observations, patient history, and epidemiological information. The expected result is Negative.  Fact Sheet for Patients:  EntrepreneurPulse.com.au  Fact Sheet for Healthcare Providers:  IncredibleEmployment.be  This test is no t yet approved or cleared by the [place] FDA and  has been authorized for detection and/or diagnosis of SARS-CoV-2 by FDA under an Emergency Use Authorization (EUA). This EUA will remain  in effect (meaning this test can be used) for the duration of the  COVID-19 declaration under Section 564(b)(1) of the Act, 21 U.S.C.section 360bbb-3(b)(1), unless the authorization is terminated  or revoked sooner.       Influenza A by PCR NEGATIVE NEGATIVE Final   Influenza B by PCR NEGATIVE NEGATIVE Final    Comment: (NOTE) The Xpert Xpress SARS-CoV-2/FLU/RSV plus assay is intended as an aid in the diagnosis of influenza from Nasopharyngeal swab specimens and should not be used as a sole basis for treatment. Nasal washings and aspirates are unacceptable for Xpert Xpress SARS-CoV-2/FLU/RSV testing.  Fact Sheet for Patients: EntrepreneurPulse.com.au  Fact Sheet for Healthcare Providers: IncredibleEmployment.be  This test is not yet approved or cleared by the [place] FDA and has been authorized for detection and/or diagnosis of SARS-CoV-2 by FDA under an Emergency Use Authorization (EUA). This EUA will remain in effect  (meaning this test can be used) for the duration of the COVID-19 declaration under Section 564(b)(1) of the Act, 21 U.S.C. section 360bbb-3(b)(1), unless the authorization is terminated or revoked.  Performed at [facility], [address] [place], [place] [address]      Radiological Exams on Admission: CT HEAD WO CONTRAST  Result Date: 04/26/2021 CLINICAL DATA:  Right-sided weakness, stroke suspected EXAM: CT HEAD WITHOUT CONTRAST TECHNIQUE: Contiguous axial images were obtained from the base of the skull through the vertex without intravenous contrast. COMPARISON:  None. FINDINGS: Brain: There is an approximately 1.7 cm intra-axial mass lesion in the posterior left frontal lobe with moderate surrounding vasogenic edema and minimal associated sulcal effacement. No additional definite brain lesions or areas of vasogenic edema visualized. No acute intracranial hemorrhage. No hydrocephalus. No extra-axial fluid collections or herniation. Vascular: Calcified plaques in the carotid siphons. Skull: Normal. Negative for fracture or focal lesion. Sinuses/Orbits: Mild chronic mucosal thickening in the maxillary sinuses. No acute process identified. Other: Partially visualized 2 x 1.9 cm rounded hypodense mass in the right neck parapharyngeal space at the level of the nasopharynx. IMPRESSION: 1. Approximally 1.7 cm left frontal lobe mass with moderate surrounding vasogenic edema and minimal mass effect. May represent metastatic or primary lesion. MRI brain with contrast recommended as well as oncologic workup as indicated. 2. Partially visualized hypodense mass in the right neck parapharyngeal space. Indeterminate but could also represent metastasis given the brain findings. Could also be evaluated further with CT neck with contrast. Electronically Signed   By: Ofilia Neas M.D.   On: 04/26/2021 15:57   CT Soft Tissue Neck W Contrast  Result Date: 04/26/2021 CLINICAL DATA:  Right  parapharyngeal mass noted on head CT EXAM: CT NECK WITH CONTRAST TECHNIQUE: Multidetector CT imaging of the neck was performed using the standard protocol following the bolus administration of intravenous contrast. CONTRAST:  112m OMNIPAQUE IOHEXOL 300 MG/ML  SOLN COMPARISON:  No prior neck CT, correlation is made with 04/26/2021 CT head FINDINGS: Pharynx and larynx: Normal. No mass or swelling. Salivary glands: No inflammation, mass, or stone. Thyroid: Subcentimeter lesion in the left thyroid lobe (series 3, image 80). Otherwise negative. Lymph nodes: Heterogeneously enhancing, centrally hypodense mass in the right parapharyngeal space, favored to represent an enlarged right level 2A lymph node, measuring up to 2.2 x 2.8 x 3.1 cm (AP x TR x CC) (series 3, image 37 and series 7, image 63). No other enlarged or abnormal appearing lymph nodes. Vascular: Negative. Limited intracranial: Negative. Previously noted left frontal lobe mass is not included in the field of view. Visualized orbits: Status post bilateral lens replacements. Otherwise negative. Mastoids and  visualized paranasal sinuses: Mucosal thickening in the bilateral maxillary sinuses. Otherwise negative. Skeleton: Degenerative changes in the cervical spine. No acute osseous abnormality. Upper chest: Please see same-day CT chest Other: None. IMPRESSION: 1. Heterogeneously enhancing, centrally hypodense mass in the right parapharyngeal space, favored to represent an enlarged, necrotic right level 2A lymph node, concerning for metastatic disease. Biopsy is recommended. 2. No other acute finding in the neck. Electronically Signed   By: Merilyn Baba M.D.   On: 04/26/2021 18:48   CT CHEST ABDOMEN PELVIS W CONTRAST  Result Date: 04/26/2021 CLINICAL DATA:  Right parapharyngeal mass, left temporal lobe mass, concern for metastatic disease EXAM: CT CHEST, ABDOMEN, AND PELVIS WITH CONTRAST TECHNIQUE: Multidetector CT imaging of the chest, abdomen and pelvis was  performed following the standard protocol during bolus administration of intravenous contrast. CONTRAST:  159m OMNIPAQUE IOHEXOL 300 MG/ML  SOLN COMPARISON:  None. FINDINGS: CT CHEST FINDINGS Cardiovascular: The heart is unremarkable without pericardial effusion. While this examination is not optimized for evaluation of the pulmonary vasculature, I do not see any filling defects or pulmonary emboli. No evidence of thoracic aortic aneurysm or dissection. Moderate atherosclerosis throughout the aorta and coronary vasculature. Mediastinum/Nodes: Pathologic adenopathy within the mediastinum and hila. Precarinal lymph node measures up to 15 mm in short axis, reference image 29/3. Thyroid, trachea, and esophagus are unremarkable. Lungs/Pleura: There is a spiculated left upper lobe mass measuring 4.3 x 3.9 x 3.0 cm, consistent with primary lung cancer. Right hilar mass versus adenopathy identified, reference image 74/4, measuring 1.6 x 1.2 by 1.9 cm. This results in partial obstruction of right upper lobe segmental bronchi. No airspace disease, effusion, or pneumothorax. Musculoskeletal: There are no acute or destructive bony lesions. Reconstructed images demonstrate no additional findings. CT ABDOMEN PELVIS FINDINGS Hepatobiliary: Small gallstones without cholecystitis. The liver is unremarkable. Pancreas: Unremarkable. No pancreatic ductal dilatation or surrounding inflammatory changes. Spleen: Normal in size without focal abnormality. Adrenals/Urinary Tract: Thickening of the medial limb of the left adrenal gland measuring up to 8 mm. No discrete right adrenal findings. Metastatic disease is not excluded. The kidneys enhance normally and symmetrically. Bladder is markedly distended, without filling defect. Stomach/Bowel: Nonspecific wall thickening of the gastric cardia measuring up to 2.1 cm. No bowel obstruction or ileus. Vascular/Lymphatic: 3.7 cm infrarenal abdominal aortic aneurysm, with extensive mural thrombus.  Diffuse atherosclerosis. 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 No pathologic adenopathy within the abdomen or pelvis. Reproductive: Uterus and bilateral adnexa are unremarkable. Other: No free fluid or free gas.  No abdominal wall hernia. Musculoskeletal: No acute or destructive bony lesions. Reconstructed images demonstrate no additional findings. IMPRESSION: 1. Large spiculated left upper lobe mass consistent with primary lung cancer. 2. Right hilar mass versus adenopathy, with partial obstruction of segmental right upper lobe bronchi. 3. Mediastinal and hilar adenopathy consistent with nodal metastases. 4. Nonspecific left adrenal thickening, metastatic disease not excluded. 5. Nonspecific wall thickening of the gastric cardia. 6. 3.7 cm infrarenal abdominal aortic aneurysm. Recommend follow-up every 2 years. Reference: J Am Coll Radiol 21914;78:[id number] 7. 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 The bilateral profundus femoral arteries remain patent. 8. Cholelithiasis without cholecystitis. 9.  Aortic Atherosclerosis (ICD10-I70.0). Electronically Signed   By: MRanda NgoM.D.   On: 04/26/2021 18:52    EKG: Independently reviewed.  Normal sinus rhythm.  Assessment/Plan Principal Problem:   Brain mass    Brain mass with vasogenic edema likely metastatic given that CT  scan is showing left upper lobe large spiculated mass and also had possible metastatic lesions with neck and adrenal glands with hilar and mediastinal adenopathy -for now patient is kept on Decadron for the vasogenic edema with brain mass.  Neurosurgery has been consulted.  We will consult IR for biopsy.  Likely will need oncology input of the biopsy. Tobacco abuse advised about quitting.  Since patient has metastatic disease which is newly diagnosed with brain mass vasogenic edema will need close monitoring and  further work-up and inpatient status.   DVT prophylaxis: SCDs.  Avoiding anticoagulation in anticipation of possible biopsy. Code Status: Full code.   Family Communication: Discussed with patient. Disposition Plan: Home. Consults called: Neurosurgery. Admission status: Inpatient.   Rise Patience MD [facility] Pager [phone].  If 7PM-7AM, please contact night-coverage [email or web address] Password [email or web address]  04/27/2021, 2:11 AM

## 2021-04-27 NOTE — Progress Notes (Signed)
PROGRESS NOTE  Cynthia Gonzalez  DOB: [birth year]  PCP: Nolene Ebbs, MD DVV:[id number]  DOA: 04/26/2021  LOS: 1 day  Hospital Day: 2  Chief Complaint  Patient presents with   Hand Problem   Brief narrative: Cynthia Gonzalez is a 71 y.o. female with PMH significant for chronic smoking who presented to the ED on 04/26/2021 with complaint of persistent weakness in the right upper extremity for last 3 weeks. In the ED, patient was afebrile, heart rate 102, blood pressure 152/78, breathing on room air. CT head showed 1.7 cm left frontal lobe mass with moderate vasogenic edema, minimal mass-effect.  It also showed partially visualized hypodense mass in the right neck parapharyngeal space. MRI brain confirmed the finding and suggested to be concerning for solitary intracranial metastasis. CT chest abdomen pelvis showed a large spiculated left lung upper lobe mass concerning for malignancy.  It also showed right hilar mass versus adenopathy with partial obstruction of segmental right upper bronchi, mediastinal and hilar adenopathy consistent with nodal metastasis CT soft tissue neck showed heterogeneously enhancing centrally hypodense mass in the right parapharyngeal space suspicious for an enlarged necrotic lymph node concerning for metastatic disease. Patient was admitted to hospitalist service Neurosurgery, IR consultation were obtained  Subjective: Patient was seen and examined this morning. Pleasant, elderly Caucasian female.  Propped up in bed.  Not in distress.  Continues to have right upper extremity weakness. No other symptoms. I updated the patient and her daughter about the scan findings of potential metastatic cancer and further work-up plan.  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  Suspected large left lung upper lobe mass concerning for malignancy with associated hilar adenopathy, nodal metastasis, solitary brain  metastasis. -IR neurosurgery and and pulmonary consulted -May need bronchoscopic biopsy.   Enlarged necrotic lymph node on right parapharyngeal space -Probably nodal metastasis of suspected lung cancer. -Not clear at this time if patient needs an ENT consult for biopsy of this lymph node.  Will discuss with oncology Dr. Burr Medico.   Left frontoparietal mass with vasogenic edema -Presented with right upper extremity weakness as a primary complaint -Solitary neurosurgery consult appreciated.  Noted a tentative plan of stereotactic radiosurgery. -Currently on dexamethasone  Chronic smoking -Has been smoking half pack per day for several years -Counseled to stop smoking.  AAA -Imagings on admission also showed 3.7 cm infrarenal abdominal aortic aneurysm. -Probably related to chronic smoking.  Recommend follow-up every 2 years.  Peripheral artery disease  -Imagings on admission also showed chronic appearing occlusion of the bilateral superficial femoral arteries. The bilateral profundus femoral arteries remain patent. -Peripheral artery disease probably related to chronic smoking. -May benefit from aspirin and statin in the long-term..  Obtain liver enzymes.  Mobility: Independent.  Encourage ambulation  Living condition: lives at home with daughter. Goals of care:   Code Status: Full Code  Nutritional status: Body mass index is 19.64 kg/m.      Diet:  Diet Order             Diet regular Room service appropriate? Yes; Fluid consistency: Thin  Diet effective now                  DVT prophylaxis:  SCDs Start: 04/27/21 0210   Antimicrobials: None Fluid: None Consultants: Neurosurgery, IR, pulmonology Family Communication: Talked to daughter on the phone  Status is: Inpatient  Continue in-hospital care because: Needs work-up for malignancy Level of care: Telemetry Medical   Dispo: The  patient is from: Home              Anticipated d/c is to: Home              Patient  currently is not medically stable to d/c.   Difficult to place patient No     Infusions:    Scheduled Meds:  dexamethasone (DECADRON) injection  4 mg Intravenous Q6H   [START ON 04/28/2021] influenza vaccine adjuvanted  0.5 mL Intramuscular Tomorrow-1000    PRN meds: acetaminophen **OR** acetaminophen   Antimicrobials: Anti-infectives (From admission, onward)    None       Objective: Vitals:   04/27/21 0356 04/27/21 0801  BP: 136/72 138/72  Pulse: 70 76  Resp: 16 17  Temp: 97.7 F (36.5 C) (!) 97.5 F (36.4 C)  SpO2: 97% 97%   No intake or output data in the 24 hours ending 04/27/21 1041 Filed Weights   04/26/21 1348  Weight: 53.5 kg   Weight change:  Body mass index is 19.64 kg/m.   Physical Exam: General exam: Pleasant, elderly Caucasian female.  Not in physical distress Skin: No rashes, lesions or ulcers. HEENT: Atraumatic, normocephalic, no obvious bleeding Lungs: Clear to auscultation bilaterally CVS: Regular rate and rhythm, no murmur GI/Abd soft, nontender, nondistended, bowel sound present CNS: Alert, awake oriented x3, right upper extremity weakness persists Psychiatry: Mood appropriate Extremities: No pedal edema, no calf tenderness  Data Review: I have personally reviewed the laboratory data and studies available.  F/u labs ordered Unresulted Labs (From admission, onward)     Start     Ordered   04/28/21 0500  CBC with Differential/Platelet  Tomorrow morning,   R       Question:  Specimen collection method  Answer:  Lab=Lab collect   04/27/21 1035   04/28/21 0500  Hepatic function panel  Tomorrow morning,   R       Question:  Specimen collection method  Answer:  Lab=Lab collect   04/27/21 1035            Signed, Terrilee Croak, MD [facility] 04/27/2021

## 2021-04-27 NOTE — H&P (View-Only) (Signed)
NAME:  Cynthia Gonzalez, MRN:  [id number], DOB:  [birth year], LOS: 1 ADMISSION DATE:  04/26/2021, CONSULTATION DATE: 04/27/2020 REFERRING MD: [facility], CHIEF COMPLAINT: Right hand weakness  History of Present Illness:  71 year old female he started smoking age 8 one half pack a day and is essentially healthy.  She notes 2 to 3 weeks of increasing weakness on her right hand.  She is evaluated emergency department and found to have a 1.7 cm left frontal lobe mass with vasogenic edema.  She also noted to have CT of the chest which shows a left upper lobe spiculated mass and soft tissues CT of neck which shows mass in the right parapharyngeal space suspicious for metastatic disease.  IR was asked to do tissue biopsy but felt it would be more advantageous for pulmonary to do it via fiberoptic bronchoscopy.  She has been evaluated by pulmonary and has been set up for fiberoptic bronchoscopy in the near future with a goal of tissue biopsy.  [facility]  Pertinent  Medical History   Past Medical History:  Diagnosis Date   Allergy    Blood transfusion without reported diagnosis 1976   Hyperlipidemia      Significant Hospital Events: Including procedures, antibiotic start and stop dates in addition to other pertinent events   04/27/2021 pulmonary consult  Interim History / Subjective:  71 year old female with finding of head neck and lung cancer  Objective   Blood pressure 138/72, pulse 76, temperature (!) 97.5 F (36.4 C), temperature source Oral, resp. rate 17, height 5' 5"  (1.651 m), weight 53.5 kg, SpO2 97 %.       No intake or output data in the 24 hours ending 04/27/21 1058 Filed Weights   04/26/21 1348  Weight: 53.5 kg    Examination: General: Well-nourished well-developed female no acute distress HENT: No JVD is appreciated Lungs: Lungs are clear to auscultation Cardiovascular: Heart sounds are regular regular rate rhythm    Abdomen: Abdomen soft nontender Extremities: Warm without  edema Neuro: Grossly intact but with right arm weakness that is improving on her fourth dose of steroid GU: Voids  Resolved Hospital Problem list     Assessment & Plan:  New diagnosis of lung cancer with metastasis to brain and also with a right parapharyngeal space suspicious for cancer.  71 year old whose only symptom was right arm weakness presented on 04/26/2020 CT of the head revealed mass along with left long and right para pharyngeal mass suspicious for cancer.  She was evaluated by neurosurgery with no surgical surgical interventions required she is also evaluated by interventional radiology for questionable biopsy of the mass pulmonary to evaluate her for fiberoptic bronchoscopy for tissue biopsy.  Pulmonary evaluation for possible fiberoptic bronchoscopy with tissue biopsy Radiation and medical oncology once tissue biopsy is obtained.   Best Practice (right click and "Reselect all SmartList Selections" daily)   Per primary  Labs   CBC: Recent Labs  Lab 04/26/21 1608 04/27/21 0220  WBC 6.9 3.3*  NEUTROABS 4.4  --   HGB 14.0 13.1  HCT 41.6 38.7  MCV 88.1 87.8  PLT 461* 418*    Basic Metabolic Panel: Recent Labs  Lab 04/26/21 1608 04/27/21 0220  NA 135 135  K 3.8 4.2  CL 101 105  CO2 24 21*  GLUCOSE 78 171*  BUN 10 9  CREATININE 0.79 0.68  CALCIUM 9.0 9.5   GFR: Estimated Creatinine Clearance: 53.7 mL/min (by C-G formula based on SCr of 0.68 mg/dL). Recent Labs  Lab  04/26/21 1608 04/27/21 0220  WBC 6.9 3.3*    Liver Function Tests: Recent Labs  Lab 04/26/21 1608 04/27/21 0220  AST 12* 12*  ALT 7 8  ALKPHOS 102 105  BILITOT 0.3 0.4  PROT 7.6 6.7  ALBUMIN 3.7 3.3*   No results for input(s): LIPASE, AMYLASE in the last 168 hours. No results for input(s): AMMONIA in the last 168 hours.  ABG No results found for: PHART, PCO2ART, PO2ART, HCO3, TCO2, ACIDBASEDEF, O2SAT   Coagulation Profile: Recent Labs  Lab 04/26/21 1608  INR 0.9     Cardiac Enzymes: No results for input(s): CKTOTAL, CKMB, CKMBINDEX, TROPONINI in the last 168 hours.  HbA1C: No results found for: HGBA1C  CBG: No results for input(s): GLUCAP in the last 168 hours.  Review of Systems:   10 point review of system taken, please see HPI for positives and negatives.   Past Medical History:  She,  has a past medical history of Allergy, Blood transfusion without reported diagnosis (1976), and Hyperlipidemia.   Surgical History:   Past Surgical History:  Procedure Laterality Date   DENTAL SURGERY     with sedative per pt, not sedated   [place]     x7-sedation with 1 delivery 1973     Social History:   reports that she has been smoking cigarettes. She has a 10.00 pack-year smoking history. She has never used smokeless tobacco. She reports current alcohol use. She reports that she does not use drugs.   Family History:  Her family history includes Cancer in her father; Cirrhosis in her mother. There is no history of Colon cancer, Rectal cancer, or Stomach cancer.   Allergies No Known Allergies   Home Medications  Prior to Admission medications   Medication Sig Start Date End Date Taking? Authorizing Provider  Calcium Carbonate (CALCIUM 600 PO) Take 600 mg by mouth daily.    [provider]  Cholecalciferol (VITAMIN D3) 2000 UNITS TABS Take 2,000 Units by mouth daily.    [provider]  Multiple Vitamin (MULTIVITAMIN) tablet Take 1 tablet by mouth daily.    [provider]  Na Sulfate-K Sulfate-Mg Sulf (SUPREP BOWEL PREP) SOLN Take 1 kit by mouth once. suprep as directed. No substitutions 08/20/14   Lafayette Dragon, MD  simvastatin (ZOCOR) 20 MG tablet Take 20 mg by mouth daily at 6 PM.    [provider]     Critical care time: Ferol Luz Aislynn Cifelli ACNP Acute Care Nurse Practitioner [place] Please consult Amion 04/27/2021, 10:58 AM

## 2021-04-27 NOTE — Progress Notes (Signed)
IR received request to evaluate this patient for an image-guided lung mass biopsy - imaging reviewed by Dr. Anselm Pancoast. The lung mass is necrotic particularly along the lateral borders and the patient would need a PET prior to any biopsy with IR. IR recommends pulmonary consult first - they may be able to obtain tissue samples with bronchoscopy. ENT consult also recommended for parapharyngeal mass which is too deep for IR to biopsy.   No IR procedure planned at this time and the order will be deleted. IR available for re-consult after additional work up has been done. Dr. Pietro Cassis notified via [place].    Cynthia Gonzalez, [place] [phone] 04/27/2021, 9:45 AM

## 2021-04-27 NOTE — Consult Note (Signed)
Radiation Oncology         [phone] ________________________________  Initial inpatient Consultation  Name: Cynthia Gonzalez MRN: [id number]  Date of Service: 04/26/2021 DOB: 08-06-1950  ER:[id number], Christean Grief, MD  No ref. provider found   REFERRING PHYSICIAN: No ref. provider found  DIAGNOSIS: 71 y.o. 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    ICD-10-CM   1. Intracranial mass  R90.0       HISTORY OF PRESENT ILLNESS: Cynthia Gonzalez is a 71 y.o. female seen at the request of Dr. Ellene Route.  She presented to the emergency department on 04/26/2021 with complaints of persistent weakness in the right upper extremity ongoing for approximately 3 weeks.  A CT of the head performed on admission showed a 1.7 cm left frontal lobe mass with moderate surrounding vasogenic edema and minimal mass-effect.  There was also a partially visualized hypodense mass in the right neck parapharyngeal space, indeterminate but felt to potentially represent metastasis given the brain findings.  This was evaluated further with a CT soft tissue neck and this confirmed a heterogeneously enhancing, centrally hypodense mass in the right parapharyngeal space, favored to represent an enlarged, necrotic right level 2A lymph node, concerning for metastatic disease.  In light of these findings, a CT C/A/P was performed and this showed a large, spiculated, left upper lobe mass, consistent with primary bronchogenic carcinoma as well as a right hilar mass vs adenopathy, consistent with nodal metastases. An MRI brain was performed for further evaluation of the brain lesion noted on CT and this confirmed a 1.9 x 1.5 x 1.9 cm intra parenchymal mass involving the posterior left frontoparietal region with surrounding vasogenic edema, most concerning for a solitary intracranial metastasis.  There were no other acute  intracranial processes noted.  Again visualized, was a 2.1 x 2.3 cm heterogeneous mass within the right parapharyngeal space felt most likely to represent nodal metastasis.  She was started on dexamethasone IV and neurosurgery was consulted.  Dr. Ellene Route did not feel that she was a surgical candidate and instead, recommended stereotactic radiosurgery for treatment of the solitary brain metastasis.  She has noted some improvement in the right hand weakness since starting the steroids.  Pulmonology was also consulted and she is scheduled for bronchoscopy/EBUS for tissue biopsy on 04/28/2021.  We have been consulted to discuss the potential role for stereotactic radiosurgery in the management of her solitary brain metastasis.  PREVIOUS RADIATION THERAPY: No  PAST MEDICAL HISTORY:  Past Medical History:  Diagnosis Date   Allergy    Blood transfusion without reported diagnosis 1976   Hyperlipidemia       PAST SURGICAL HISTORY: Past Surgical History:  Procedure Laterality Date   DENTAL SURGERY     with sedative per pt, not sedated   TUBAL LIGATION  1983   VAGINAL DELIVERY     x7-sedation with 1 delivery 1973    FAMILY HISTORY:  Family History  Problem Relation Age of Onset   Cirrhosis Mother    Cancer Father    Colon cancer Neg Hx    Rectal cancer Neg Hx    Stomach cancer Neg Hx     SOCIAL HISTORY:  Social History   Socioeconomic History   Marital status: Single    Spouse name: Not on file   Number of children: Not on file   Years of education: Not on file   Highest education level: Not  on file  Occupational History   Not on file  Tobacco Use   Smoking status: Every Day    Packs/day: 0.25    Years: 40.00    Pack years: 10.00    Types: Cigarettes   Smokeless tobacco: Never  Vaping Use   Vaping Use: Never used  Substance and Sexual Activity   Alcohol use: Yes    Comment: occ   Drug use: No   Sexual activity: Not Currently    Partners: Male    Birth control/protection:  Post-menopausal  Other Topics Concern   Not on file  Social History Narrative   Not on file   Social Determinants of Health   Financial Resource Strain: Not on file  Food Insecurity: Not on file  Transportation Needs: Not on file  Physical Activity: Not on file  Stress: Not on file  Social Connections: Not on file  Intimate Partner Violence: Not on file    ALLERGIES: Patient has no known allergies.  MEDICATIONS:  Current Facility-Administered Medications  Medication Dose Route Frequency Provider Last Rate Last Admin   acetaminophen (TYLENOL) tablet 650 mg  650 mg Oral Q6H PRN Rise Patience, MD       Or   acetaminophen (TYLENOL) suppository 650 mg  650 mg Rectal Q6H PRN Rise Patience, MD       dexamethasone (DECADRON) injection 4 mg  4 mg Intravenous Q6H Rise Patience, MD   4 mg at 04/27/21 1152   [START ON 04/28/2021] influenza vaccine adjuvanted (FLUAD) injection 0.5 mL  0.5 mL Intramuscular Tomorrow-1000 Amin, Ankit Chirag, MD        REVIEW OF SYSTEMS:  On review of systems, the patient reports that she is doing well overall.  She denies any chest pain, shortness of breath, cough, fevers, chills, night sweats, or unintended weight changes.  She denies headaches, dizziness/imbalance, tremor, or seizure activity.  She denies any bowel or bladder disturbances, and denies abdominal pain, nausea or vomiting.  She denies any new musculoskeletal or joint aches or pains and reports improvement in the right upper extremity weakness that brought her into the hospital. A complete review of systems is obtained and is otherwise negative.    PHYSICAL EXAM:  Wt Readings from Last 3 Encounters:  04/26/21 118 lb (53.5 kg)  08/20/14 150 lb 6.4 oz (68.2 kg)  11/26/13 150 lb (68 kg)   Temp Readings from Last 3 Encounters:  04/27/21 97.8 F (36.6 C) (Oral)  11/26/13 97.3 F (36.3 C) (Oral)   BP Readings from Last 3 Encounters:  04/27/21 120/72  11/26/13 (!) 157/75    Pulse Readings from Last 3 Encounters:  04/27/21 75  11/26/13 82   Pain Assessment Pain Score: 0-No pain/10 Physical Exam: As per medical oncology evaluation this afternoon- Vitals reviewed and stable. General: Awake and alert, no distress. Eyes:  no scleral icterus.   ENT:  There were no oropharyngeal lesions.    Lymphatics:  Negative cervical, supraclavicular or axillary adenopathy.   Respiratory: Diminished breath sounds at bases. Cardiovascular:  Regular rate and rhythm, S1/S2, without murmur, rub or gallop.  There was no pedal edema.   GI:  abdomen was soft, flat, nontender, nondistended, without organomegaly.   Musculoskeletal: Strength in the left upper extremity 5/5, strength in the right upper extremity 3/5 Skin exam was without echymosis, petichae.   Neuro exam was nonfocal. Patient was alert and oriented.  Attention was good.   Language was appropriate.  Mood was normal without depression.  Speech was not pressured.  Thought content was not tangential.    KPS = 90  100 - Normal; no complaints; no evidence of disease. 90   - Able to carry on normal activity; minor signs or symptoms of disease. 80   - Normal activity with effort; some signs or symptoms of disease. 76   - Cares for self; unable to carry on normal activity or to do active work. 60   - Requires occasional assistance, but is able to care for most of his personal needs. 50   - Requires considerable assistance and frequent medical care. 28   - Disabled; requires special care and assistance. 37   - Severely disabled; hospital admission is indicated although death not imminent. 100   - Very sick; hospital admission necessary; active supportive treatment necessary. 10   - Moribund; fatal processes progressing rapidly. 0     - Dead  Karnofsky DA, Abelmann Sequoyah, Craver LS and Burchenal [facility] [phone]) The use of the nitrogen mustards in the palliative treatment of carcinoma: with particular reference to bronchogenic  carcinoma Cancer 1 634-56  LABORATORY DATA:  Lab Results  Component Value Date   WBC 3.3 (L) 04/27/2021   HGB 13.1 04/27/2021   HCT 38.7 04/27/2021   MCV 87.8 04/27/2021   PLT 418 (H) 04/27/2021   Lab Results  Component Value Date   NA 135 04/27/2021   K 4.2 04/27/2021   CL 105 04/27/2021   CO2 21 (L) 04/27/2021   Lab Results  Component Value Date   ALT 8 04/27/2021   AST 12 (L) 04/27/2021   ALKPHOS 105 04/27/2021   BILITOT 0.4 04/27/2021     RADIOGRAPHY: CT HEAD WO CONTRAST  Result Date: 04/26/2021 CLINICAL DATA:  Right-sided weakness, stroke suspected EXAM: CT HEAD WITHOUT CONTRAST TECHNIQUE: Contiguous axial images were obtained from the base of the skull through the vertex without intravenous contrast. COMPARISON:  None. FINDINGS: Brain: There is an approximately 1.7 cm intra-axial mass lesion in the posterior left frontal lobe with moderate surrounding vasogenic edema and minimal associated sulcal effacement. No additional definite brain lesions or areas of vasogenic edema visualized. No acute intracranial hemorrhage. No hydrocephalus. No extra-axial fluid collections or herniation. Vascular: Calcified plaques in the carotid siphons. Skull: Normal. Negative for fracture or focal lesion. Sinuses/Orbits: Mild chronic mucosal thickening in the maxillary sinuses. No acute process identified. Other: Partially visualized 2 x 1.9 cm rounded hypodense mass in the right neck parapharyngeal space at the level of the nasopharynx. IMPRESSION: 1. Approximally 1.7 cm left frontal lobe mass with moderate surrounding vasogenic edema and minimal mass effect. May represent metastatic or primary lesion. MRI brain with contrast recommended as well as oncologic workup as indicated. 2. Partially visualized hypodense mass in the right neck parapharyngeal space. Indeterminate but could also represent metastasis given the brain findings. Could also be evaluated further with CT neck with contrast.  Electronically Signed   By: Ofilia Neas M.D.   On: 04/26/2021 15:57   CT Soft Tissue Neck W Contrast  Result Date: 04/26/2021 CLINICAL DATA:  Right parapharyngeal mass noted on head CT EXAM: CT NECK WITH CONTRAST TECHNIQUE: Multidetector CT imaging of the neck was performed using the standard protocol following the bolus administration of intravenous contrast. CONTRAST:  194mL OMNIPAQUE IOHEXOL 300 MG/ML  SOLN COMPARISON:  No prior neck CT, correlation is made with 04/26/2021 CT head FINDINGS: Pharynx and larynx: Normal. No mass or swelling. Salivary glands: No inflammation, mass, or stone. Thyroid: Subcentimeter lesion  in the left thyroid lobe (series 3, image 80). Otherwise negative. Lymph nodes: Heterogeneously enhancing, centrally hypodense mass in the right parapharyngeal space, favored to represent an enlarged right level 2A lymph node, measuring up to 2.2 x 2.8 x 3.1 cm (AP x TR x CC) (series 3, image 37 and series 7, image 63). No other enlarged or abnormal appearing lymph nodes. Vascular: Negative. Limited intracranial: Negative. Previously noted left frontal lobe mass is not included in the field of view. Visualized orbits: Status post bilateral lens replacements. Otherwise negative. Mastoids and visualized paranasal sinuses: Mucosal thickening in the bilateral maxillary sinuses. Otherwise negative. Skeleton: Degenerative changes in the cervical spine. No acute osseous abnormality. Upper chest: Please see same-day CT chest Other: None. IMPRESSION: 1. Heterogeneously enhancing, centrally hypodense mass in the right parapharyngeal space, favored to represent an enlarged, necrotic right level 2A lymph node, concerning for metastatic disease. Biopsy is recommended. 2. No other acute finding in the neck. Electronically Signed   By: Merilyn Baba M.D.   On: 04/26/2021 18:48   MR Brain W and Wo Contrast  Result Date: 04/27/2021 CLINICAL DATA:  Initial evaluation for brain CNS neoplasm. EXAM: MRI HEAD  WITHOUT AND WITH CONTRAST TECHNIQUE: Multiplanar, multiecho pulse sequences of the brain and surrounding structures were obtained without and with intravenous contrast. CONTRAST:  57mL GADAVIST GADOBUTROL 1 MMOL/ML IV SOLN COMPARISON:  Multiple previous CTs from 04/26/2021. FINDINGS: Brain: Cerebral volume within normal limits. No evidence for acute or subacute infarct. No acute or chronic intracranial hemorrhage. Intraparenchymal mass positioned along the gray-white matter junction at the posterior left frontoparietal region measures 1.9 x 1.5 x 1.9 cm (series 18, image 37). Surrounding T2/FLAIR signal intensity consistent with vasogenic edema. No midline shift. 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 No other mass lesion, abnormal enhancement, or visible intracranial metastatic disease. Ventricles normal size without hydrocephalus. No extra-axial fluid collection. Pituitary gland suprasellar region within normal limits. Vascular: Major intracranial vascular flow voids are maintained. Skull and upper cervical spine: Craniocervical junction within normal limits. Few scattered probable benign hemangiomata noted within the visualized upper cervical spine and about the skull base. No worrisome focal marrow replacing lesion. No scalp soft tissue abnormality. Sinuses/Orbits: Patient status post bilateral ocular lens replacement. Globes and orbital soft tissues demonstrate no acute finding. Mild mucosal thickening noted within the ethmoidal air cells and maxillary sinuses. Trace bilateral mastoid effusions noted, of doubtful significance. Inner ear structures grossly normal. Other: 2.1 x 2.3 cm heterogeneous mass noted within the right parapharyngeal space, better evaluated on prior neck CT. IMPRESSION: 1. 1.9 x 1.5 x 1.9 cm intraparenchymal mass involving the posterior left frontoparietal region with surrounding vasogenic edema. Given the findings on prior CTs,  finding is most concerning for a solitary intracranial metastasis. 2. No other acute intracranial process. 3. 2.1 x 2.3 cm heterogeneous mass within the right parapharyngeal space, better evaluated on prior neck CT. Electronically Signed   By: Jeannine Boga M.D.   On: 04/27/2021 05:05   CT CHEST ABDOMEN PELVIS W CONTRAST  Result Date: 04/26/2021 CLINICAL DATA:  Right parapharyngeal mass, left temporal lobe mass, concern for metastatic disease EXAM: CT CHEST, ABDOMEN, AND PELVIS WITH CONTRAST TECHNIQUE: Multidetector CT imaging of the chest, abdomen and pelvis was performed following the standard protocol during bolus administration of intravenous contrast. CONTRAST:  161mL OMNIPAQUE IOHEXOL 300 MG/ML  SOLN COMPARISON:  None. FINDINGS: CT CHEST FINDINGS Cardiovascular: The heart is unremarkable without pericardial effusion.  While this examination is not optimized for evaluation of the pulmonary vasculature, I do not see any filling defects or pulmonary emboli. No evidence of thoracic aortic aneurysm or dissection. Moderate atherosclerosis throughout the aorta and coronary vasculature. Mediastinum/Nodes: Pathologic adenopathy within the mediastinum and hila. Precarinal lymph node measures up to 15 mm in short axis, reference image 29/3. Thyroid, trachea, and esophagus are unremarkable. Lungs/Pleura: There is a spiculated left upper lobe mass measuring 4.3 x 3.9 x 3.0 cm, consistent with primary lung cancer. Right hilar mass versus adenopathy identified, reference image 74/4, measuring 1.6 x 1.2 by 1.9 cm. This results in partial obstruction of right upper lobe segmental bronchi. No airspace disease, effusion, or pneumothorax. Musculoskeletal: There are no acute or destructive bony lesions. Reconstructed images demonstrate no additional findings. CT ABDOMEN PELVIS FINDINGS Hepatobiliary: Small gallstones without cholecystitis. The liver is unremarkable. Pancreas: Unremarkable. No pancreatic ductal dilatation  or surrounding inflammatory changes. Spleen: Normal in size without focal abnormality. Adrenals/Urinary Tract: Thickening of the medial limb of the left adrenal gland measuring up to 8 mm. No discrete right adrenal findings. Metastatic disease is not excluded. The kidneys enhance normally and symmetrically. Bladder is markedly distended, without filling defect. Stomach/Bowel: Nonspecific wall thickening of the gastric cardia measuring up to 2.1 cm. No bowel obstruction or ileus. Vascular/Lymphatic: 3.7 cm infrarenal abdominal aortic aneurysm, with extensive mural thrombus. Diffuse atherosclerosis. 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 No pathologic adenopathy within the abdomen or pelvis. Reproductive: Uterus and bilateral adnexa are unremarkable. Other: No free fluid or free gas.  No abdominal wall hernia. Musculoskeletal: No acute or destructive bony lesions. Reconstructed images demonstrate no additional findings. IMPRESSION: 1. Large spiculated left upper lobe mass consistent with primary lung cancer. 2. Right hilar mass versus adenopathy, with partial obstruction of segmental right upper lobe bronchi. 3. Mediastinal and hilar adenopathy consistent with nodal metastases. 4. Nonspecific left adrenal thickening, metastatic disease not excluded. 5. Nonspecific wall thickening of the gastric cardia. 6. 3.7 cm infrarenal abdominal aortic aneurysm. Recommend follow-up every 2 years. Reference: J Am Coll Radiol 4332;95:188-416. 7. 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 The bilateral profundus femoral arteries remain patent. 8. Cholelithiasis without cholecystitis. 9.  Aortic Atherosclerosis (ICD10-I70.0). Electronically Signed   By: Randa Ngo M.D.   On: 04/26/2021 18:52      IMPRESSION/PLAN: 1. 71 y.o. female with a newly diagnosed 2 cm solitary frontoparietal brain metastasis, most likely  secondary to metastatic primary bronchogenic carcinoma, pathology pending. Dr. Tammi Klippel and I have personally reviewed the patient's CT and MRI imaging and at this point, it is felt that the patient would potentially benefit from radiotherapy. The options include whole brain irradiation versus stereotactic radiosurgery. There are pros and cons associated with each of these potential treatment options. Whole brain radiotherapy would treat the known metastatic deposits and help provide some reduction of risk for future brain metastases. However, whole brain radiotherapy carries potential risks including hair loss, subacute somnolence, and neurocognitive changes including a possible reduction in short-term memory. Whole brain radiotherapy also may carry a lower likelihood of tumor control at the treatment sites because of the low-dose used. Stereotactic radiosurgery carries a higher likelihood for local tumor control at the targeted sites with lower associated risk for neurocognitive changes such as memory loss. However, the use of stereotactic radiosurgery in this setting may leave the patient at increased risk for new brain metastases elsewhere in the brain as high as  50-60%. Accordingly, patients who receive stereotactic radiosurgery in this setting should undergo ongoing surveillance imaging with brain MRI more frequently in order to identify and treat new small brain metastases before they become symptomatic. Stereotactic radiosurgery does carry some different risks, including a risk of radionecrosis.  PLAN: Today, I reviewed the findings and workup thus far with the patient. We discussed the dilemma regarding whole brain radiotherapy versus stereotactic radiosurgery. We discussed the pros and cons of each. We also discussed the logistics and delivery of each. We reviewed the results associated with each of the treatments described above. The patient seems to understand the treatment options and would like to  proceed with stereotactic radiosurgery, pending findings on the recent 3T SRS protocol brain MRI only shows few, if any, additional lesions.  We will review the new MRI brain images as soon as they are available and plan to reconnect with the patient to confirm moving forward with [facility] treatment planning.  She is hoping to be able to discharge home over the weekend so once we have tissue confirmation from her upcoming biopsy, we anticipate scheduling outpatient CT simulation and SRS treatment in the near future.  She appears to have a good understanding of her disease and our treatment recommendations and is comfortable and in agreement with the stated plan.  I personally spent 70 minutes in this encounter including chart review, reviewing radiological studies, telephone discussion with the patient, entering orders, coordinating care and completing documentation.    Nicholos Johns, PA-C    Tyler Pita, MD  [facility] Direct Dial: [phone]   Fax: [phone] [email or web address]   Skype   LinkedIn

## 2021-04-27 NOTE — Consult Note (Addendum)
[place]  [phone] Fax:(336) [phone]   MEDICAL ONCOLOGY - INITIAL CONSULTATION  Referral MD: Dr. Terrilee Croak  Reason for Referral: Lung mass, brain mass, lymphadenopathy  HPI: Cynthia Gonzalez is 71 year old female with a history of tobacco abuse who presented to the emergency department after having persistent weakness of her right upper extremity x3 weeks.  CT of the head without contrast showed a 1.7 cm left frontal lobe mass with moderate surrounding vasogenic edema and minimal mass-effect.  There was also a partially visualized hypodense mass in the right neck parapharyngeal space.  CT soft tissue neck showed a heterogeneously enhancing, centrally hypodense mass in the right parapharyngeal space favored to represent an enlarged necrotic right level 2A lymph node concerning for metastatic disease.  CT chest/abdomen/pelvis with contrast was also performed which showed a large spiculated left upper lobe mass consistent with primary lung cancer, right hilar mass versus adenopathy with partial obstruction of the segmental right upper lobe bronchi, mediastinal and hilar adenopathy consistent with nodal metastases, nonspecific left adrenal thickening and metastatic disease is not excluded.  There is also nonspecific wall thickening of the gastric cardia.  MRI of the brain with and without contrast showed a 1.9 x 1.5 x 1.9 cm intraparenchymal mass involving the posterior left frontoparietal region with surrounding vasogenic edema.  She has been started on dexamethasone.  She has been seen by neurosurgery who is recommending SRS.  IR has also reviewed the patient's scans and they recommend a PET scan prior to any biopsy by IR.  IR is also recommending a pulmonary consult first to possibly obtain tissue by bronchoscopy.  Also, ENT consult is recommended for the parapharyngeal mass which is too deep for IR biopsy.  Today, patient reports improvement in her right hand weakness.  She  is able to open her hand more than she could upon admission but is not yet able to make a fist.  Other than the right hand weakness, she reports that she has been feeling well overall.  She has not had any recent fevers, chills, headaches, dizziness, loss of appetite, weight loss.  She denies chest pain, shortness of breath, cough, hemoptysis.  Denies abdominal pain, nausea, vomiting.  The patient is single.  She has 7 children-6 that live locally and 1 child in [place].  She lives in [place], [place].  She reports that she smokes about 5 cigarettes/day and has done so for many years.  Drinks alcohol rarely.  Family history significant for a father with head neck cancer and a maternal grandfather with cancer but unclear what the primary was.  Medical oncology was asked to see the patient for recommendations regarding her abnormal CT scan findings.  Past Medical History:  Diagnosis Date   Allergy    Blood transfusion without reported diagnosis 1976   Hyperlipidemia   :   Past Surgical History:  Procedure Laterality Date   DENTAL SURGERY     with sedative per pt, not sedated   TUBAL LIGATION  1983   VAGINAL DELIVERY     x7-sedation with 1 delivery 1973  :   Current Facility-Administered Medications  Medication Dose Route Frequency Provider Last Rate Last Admin   acetaminophen (TYLENOL) tablet 650 mg  650 mg Oral Q6H PRN Rise Patience, MD       Or   acetaminophen (TYLENOL) suppository 650 mg  650 mg Rectal Q6H PRN Rise Patience, MD       dexamethasone (DECADRON) injection 4 mg  4 mg Intravenous Q6H Rise Patience, MD   4 mg at 04/27/21 0603   [START ON 04/28/2021] influenza vaccine adjuvanted (FLUAD) injection 0.5 mL  0.5 mL Intramuscular Tomorrow-1000 Amin, Ankit Chirag, MD         No Known Allergies:   Family History  Problem Relation Age of Onset   Cirrhosis Mother    Cancer Father    Colon cancer Neg Hx    Rectal cancer Neg Hx    Stomach cancer  Neg Hx   :   Social History   Socioeconomic History   Marital status: Single    Spouse name: Not on file   Number of children: Not on file   Years of education: Not on file   Highest education level: Not on file  Occupational History   Not on file  Tobacco Use   Smoking status: Every Day    Packs/day: 0.25    Years: 40.00    Pack years: 10.00    Types: Cigarettes   Smokeless tobacco: Never  Vaping Use   Vaping Use: Never used  Substance and Sexual Activity   Alcohol use: Yes    Comment: occ   Drug use: No   Sexual activity: Not Currently    Partners: Male    Birth control/protection: Post-menopausal  Other Topics Concern   Not on file  Social History Narrative   Not on file   Social Determinants of Health   Financial Resource Strain: Not on file  Food Insecurity: Not on file  Transportation Needs: Not on file  Physical Activity: Not on file  Stress: Not on file  Social Connections: Not on file  Intimate Partner Violence: Not on file  :  Review of Systems: A comprehensive 14 point review of systems was negative except as noted in the HPI.  Exam: Patient Vitals for the past 24 hrs:  BP Temp Temp src Pulse Resp SpO2 Height Weight  04/27/21 0801 138/72 (!) 97.5 F (36.4 C) Oral 76 17 97 % -- --  04/27/21 0356 136/72 97.7 F (36.5 C) Oral 70 16 97 % -- --  04/27/21 0100 (!) 148/78 98.7 F (37.1 C) Oral (!) 114 18 99 % -- --  04/26/21 2259 (!) 147/67 98 F (36.7 C) Oral (!) 121 20 96 % -- --  04/26/21 2200 (!) 146/81 -- -- (!) 117 20 99 % -- --  04/26/21 2100 117/81 -- -- (!) 118 (!) 24 96 % -- --  04/26/21 2000 135/82 -- -- (!) 111 (!) 23 97 % -- --  04/26/21 1930 (!) 118/92 -- -- (!) 112 20 96 % -- --  04/26/21 1830 (!) 159/69 -- -- 85 12 93 % -- --  04/26/21 1645 (!) 126/53 -- -- 68 (!) 23 97 % -- --  04/26/21 1552 (!) 165/83 -- -- 84 (!) 21 97 % -- --  04/26/21 1428 122/85 98.7 F (37.1 C) Oral (!) 111 18 99 % -- --  04/26/21 1348 -- -- -- -- -- -- 5'  5" (1.651 m) 53.5 kg  04/26/21 1344 (!) 152/78 98.3 F (36.8 C) Oral (!) 102 20 97 % -- --    General: Awake and alert, no distress. Eyes:  no scleral icterus.   ENT:  There were no oropharyngeal lesions.    Lymphatics:  Negative cervical, supraclavicular or axillary adenopathy.   Respiratory: Diminished breath sounds at bases. Cardiovascular:  Regular rate and rhythm, S1/S2, without murmur, rub or gallop.  There  was no pedal edema.   GI:  abdomen was soft, flat, nontender, nondistended, without organomegaly.   Musculoskeletal: Strength in the left upper extremity 5/5, strength in the right upper extremity 3/5 Skin exam was without echymosis, petichae.   Neuro exam was nonfocal. Patient was alert and oriented.  Attention was good.   Language was appropriate.  Mood was normal without depression.  Speech was not pressured.  Thought content was not tangential.     Lab Results  Component Value Date   WBC 3.3 (L) 04/27/2021   HGB 13.1 04/27/2021   HCT 38.7 04/27/2021   PLT 418 (H) 04/27/2021   GLUCOSE 171 (H) 04/27/2021   ALT 8 04/27/2021   AST 12 (L) 04/27/2021   NA 135 04/27/2021   K 4.2 04/27/2021   CL 105 04/27/2021   CREATININE 0.68 04/27/2021   BUN 9 04/27/2021   CO2 21 (L) 04/27/2021    CT HEAD WO CONTRAST  Result Date: 04/26/2021 CLINICAL DATA:  Right-sided weakness, stroke suspected EXAM: CT HEAD WITHOUT CONTRAST TECHNIQUE: Contiguous axial images were obtained from the base of the skull through the vertex without intravenous contrast. COMPARISON:  None. FINDINGS: Brain: There is an approximately 1.7 cm intra-axial mass lesion in the posterior left frontal lobe with moderate surrounding vasogenic edema and minimal associated sulcal effacement. No additional definite brain lesions or areas of vasogenic edema visualized. No acute intracranial hemorrhage. No hydrocephalus. No extra-axial fluid collections or herniation. Vascular: Calcified plaques in the carotid siphons. Skull:  Normal. Negative for fracture or focal lesion. Sinuses/Orbits: Mild chronic mucosal thickening in the maxillary sinuses. No acute process identified. Other: Partially visualized 2 x 1.9 cm rounded hypodense mass in the right neck parapharyngeal space at the level of the nasopharynx. IMPRESSION: 1. Approximally 1.7 cm left frontal lobe mass with moderate surrounding vasogenic edema and minimal mass effect. May represent metastatic or primary lesion. MRI brain with contrast recommended as well as oncologic workup as indicated. 2. Partially visualized hypodense mass in the right neck parapharyngeal space. Indeterminate but could also represent metastasis given the brain findings. Could also be evaluated further with CT neck with contrast. Electronically Signed   By: Ofilia Neas M.D.   On: 04/26/2021 15:57   CT Soft Tissue Neck W Contrast  Result Date: 04/26/2021 CLINICAL DATA:  Right parapharyngeal mass noted on head CT EXAM: CT NECK WITH CONTRAST TECHNIQUE: Multidetector CT imaging of the neck was performed using the standard protocol following the bolus administration of intravenous contrast. CONTRAST:  145m OMNIPAQUE IOHEXOL 300 MG/ML  SOLN COMPARISON:  No prior neck CT, correlation is made with 04/26/2021 CT head FINDINGS: Pharynx and larynx: Normal. No mass or swelling. Salivary glands: No inflammation, mass, or stone. Thyroid: Subcentimeter lesion in the left thyroid lobe (series 3, image 80). Otherwise negative. Lymph nodes: Heterogeneously enhancing, centrally hypodense mass in the right parapharyngeal space, favored to represent an enlarged right level 2A lymph node, measuring up to 2.2 x 2.8 x 3.1 cm (AP x TR x CC) (series 3, image 37 and series 7, image 63). No other enlarged or abnormal appearing lymph nodes. Vascular: Negative. Limited intracranial: Negative. Previously noted left frontal lobe mass is not included in the field of view. Visualized orbits: Status post bilateral lens replacements.  Otherwise negative. Mastoids and visualized paranasal sinuses: Mucosal thickening in the bilateral maxillary sinuses. Otherwise negative. Skeleton: Degenerative changes in the cervical spine. No acute osseous abnormality. Upper chest: Please see same-day CT chest Other: None. IMPRESSION: 1. Heterogeneously  enhancing, centrally hypodense mass in the right parapharyngeal space, favored to represent an enlarged, necrotic right level 2A lymph node, concerning for metastatic disease. Biopsy is recommended. 2. No other acute finding in the neck. Electronically Signed   By: Merilyn Baba M.D.   On: 04/26/2021 18:48   MR Brain W and Wo Contrast  Result Date: 04/27/2021 CLINICAL DATA:  Initial evaluation for brain CNS neoplasm. EXAM: MRI HEAD WITHOUT AND WITH CONTRAST TECHNIQUE: Multiplanar, multiecho pulse sequences of the brain and surrounding structures were obtained without and with intravenous contrast. CONTRAST:  29m GADAVIST GADOBUTROL 1 MMOL/ML IV SOLN COMPARISON:  Multiple previous CTs from 04/26/2021. FINDINGS: Brain: Cerebral volume within normal limits. No evidence for acute or subacute infarct. No acute or chronic intracranial hemorrhage. Intraparenchymal mass positioned along the gray-white matter junction at the posterior left frontoparietal region measures 1.9 x 1.5 x 1.9 cm (series 18, image 37). Surrounding T2/FLAIR signal intensity consistent with vasogenic edema. No midline shift. 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 No other mass lesion, abnormal enhancement, or visible intracranial metastatic disease. Ventricles normal size without hydrocephalus. No extra-axial fluid collection. Pituitary gland suprasellar region within normal limits. Vascular: Major intracranial vascular flow voids are maintained. Skull and upper cervical spine: Craniocervical junction within normal limits. Few scattered probable benign hemangiomata noted within the  visualized upper cervical spine and about the skull base. No worrisome focal marrow replacing lesion. No scalp soft tissue abnormality. Sinuses/Orbits: Patient status post bilateral ocular lens replacement. Globes and orbital soft tissues demonstrate no acute finding. Mild mucosal thickening noted within the ethmoidal air cells and maxillary sinuses. Trace bilateral mastoid effusions noted, of doubtful significance. Inner ear structures grossly normal. Other: 2.1 x 2.3 cm heterogeneous mass noted within the right parapharyngeal space, better evaluated on prior neck CT. IMPRESSION: 1. 1.9 x 1.5 x 1.9 cm intraparenchymal mass involving the posterior left frontoparietal region with surrounding vasogenic edema. 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 2. No other acute intracranial process. 3. 2.1 x 2.3 cm heterogeneous mass within the right parapharyngeal space, better evaluated on prior neck CT. Electronically Signed   By: BJeannine BogaM.D.   On: 04/27/2021 05:05   CT CHEST ABDOMEN PELVIS W CONTRAST  Result Date: 04/26/2021 CLINICAL DATA:  Right parapharyngeal mass, left temporal lobe mass, concern for metastatic disease EXAM: CT CHEST, ABDOMEN, AND PELVIS WITH CONTRAST TECHNIQUE: Multidetector CT imaging of the chest, abdomen and pelvis was performed following the standard protocol during bolus administration of intravenous contrast. CONTRAST:  1051mOMNIPAQUE IOHEXOL 300 MG/ML  SOLN COMPARISON:  None. FINDINGS: CT CHEST FINDINGS Cardiovascular: The heart is unremarkable without pericardial effusion. While this examination is not optimized for evaluation of the pulmonary vasculature, I do not see any filling defects or pulmonary emboli. No evidence of thoracic aortic aneurysm or dissection. Moderate atherosclerosis throughout the aorta and coronary vasculature. Mediastinum/Nodes: Pathologic adenopathy within the mediastinum and hila. Precarinal  lymph node measures up to 15 mm in short axis, reference image 29/3. Thyroid, trachea, and esophagus are unremarkable. Lungs/Pleura: There is a spiculated left upper lobe mass measuring 4.3 x 3.9 x 3.0 cm, consistent with primary lung cancer. Right hilar mass versus adenopathy identified, reference image 74/4, measuring 1.6 x 1.2 by 1.9 cm. This results in partial obstruction of right upper lobe segmental bronchi. No airspace disease, effusion, or pneumothorax. Musculoskeletal: There are no acute or destructive bony lesions.  Reconstructed images demonstrate no additional findings. CT ABDOMEN PELVIS FINDINGS Hepatobiliary: Small gallstones without cholecystitis. The liver is unremarkable. Pancreas: Unremarkable. No pancreatic ductal dilatation or surrounding inflammatory changes. Spleen: Normal in size without focal abnormality. Adrenals/Urinary Tract: Thickening of the medial limb of the left adrenal gland measuring up to 8 mm. No discrete right adrenal findings. Metastatic disease is not excluded. The kidneys enhance normally and symmetrically. Bladder is markedly distended, without filling defect. Stomach/Bowel: Nonspecific wall thickening of the gastric cardia measuring up to 2.1 cm. No bowel obstruction or ileus. Vascular/Lymphatic: 3.7 cm infrarenal abdominal aortic aneurysm, with extensive mural thrombus. Diffuse atherosclerosis. 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 No pathologic adenopathy within the abdomen or pelvis. Reproductive: Uterus and bilateral adnexa are unremarkable. Other: No free fluid or free gas.  No abdominal wall hernia. Musculoskeletal: No acute or destructive bony lesions. Reconstructed images demonstrate no additional findings. IMPRESSION: 1. Large spiculated left upper lobe mass consistent with primary lung cancer. 2. Right hilar mass versus adenopathy, with partial obstruction of segmental right upper  lobe bronchi. 3. Mediastinal and hilar adenopathy consistent with nodal metastases. 4. Nonspecific left adrenal thickening, metastatic disease not excluded. 5. Nonspecific wall thickening of the gastric cardia. 6. 3.7 cm infrarenal abdominal aortic aneurysm. Recommend follow-up every 2 years. Reference: J Am Coll Radiol 2130;86:578-469. 7. 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 The bilateral profundus femoral arteries remain patent. 8. Cholelithiasis without cholecystitis. 9.  Aortic Atherosclerosis (ICD10-I70.0). Electronically Signed   By: Randa Ngo M.D.   On: 04/26/2021 18:52     CT HEAD WO CONTRAST  Result Date: 04/26/2021 CLINICAL DATA:  Right-sided weakness, stroke suspected EXAM: CT HEAD WITHOUT CONTRAST TECHNIQUE: Contiguous axial images were obtained from the base of the skull through the vertex without intravenous contrast. COMPARISON:  None. FINDINGS: Brain: There is an approximately 1.7 cm intra-axial mass lesion in the posterior left frontal lobe with moderate surrounding vasogenic edema and minimal associated sulcal effacement. No additional definite brain lesions or areas of vasogenic edema visualized. No acute intracranial hemorrhage. No hydrocephalus. No extra-axial fluid collections or herniation. Vascular: Calcified plaques in the carotid siphons. Skull: Normal. Negative for fracture or focal lesion. Sinuses/Orbits: Mild chronic mucosal thickening in the maxillary sinuses. No acute process identified. Other: Partially visualized 2 x 1.9 cm rounded hypodense mass in the right neck parapharyngeal space at the level of the nasopharynx. IMPRESSION: 1. Approximally 1.7 cm left frontal lobe mass with moderate surrounding vasogenic edema and minimal mass effect. May represent metastatic or primary lesion. MRI brain with contrast recommended as well as oncologic workup as indicated. 2. Partially visualized hypodense mass in the right neck parapharyngeal space.  Indeterminate but could also represent metastasis given the brain findings. Could also be evaluated further with CT neck with contrast. Electronically Signed   By: Ofilia Neas M.D.   On: 04/26/2021 15:57   CT Soft Tissue Neck W Contrast  Result Date: 04/26/2021 CLINICAL DATA:  Right parapharyngeal mass noted on head CT EXAM: CT NECK WITH CONTRAST TECHNIQUE: Multidetector CT imaging of the neck was performed using the standard protocol following the bolus administration of intravenous contrast. CONTRAST:  166m OMNIPAQUE IOHEXOL 300 MG/ML  SOLN COMPARISON:  No prior neck CT, correlation is made with 04/26/2021 CT head FINDINGS: Pharynx and larynx: Normal. No mass or swelling. Salivary glands: No inflammation, mass, or stone. Thyroid: Subcentimeter lesion in the left thyroid lobe (series 3, image 80). Otherwise  negative. Lymph nodes: Heterogeneously enhancing, centrally hypodense mass in the right parapharyngeal space, favored to represent an enlarged right level 2A lymph node, measuring up to 2.2 x 2.8 x 3.1 cm (AP x TR x CC) (series 3, image 37 and series 7, image 63). No other enlarged or abnormal appearing lymph nodes. Vascular: Negative. Limited intracranial: Negative. Previously noted left frontal lobe mass is not included in the field of view. Visualized orbits: Status post bilateral lens replacements. Otherwise negative. Mastoids and visualized paranasal sinuses: Mucosal thickening in the bilateral maxillary sinuses. Otherwise negative. Skeleton: Degenerative changes in the cervical spine. No acute osseous abnormality. Upper chest: Please see same-day CT chest Other: None. IMPRESSION: 1. Heterogeneously enhancing, centrally hypodense mass in the right parapharyngeal space, favored to represent an enlarged, necrotic right level 2A lymph node, concerning for metastatic disease. Biopsy is recommended. 2. No other acute finding in the neck. Electronically Signed   By: Merilyn Baba M.D.   On: 04/26/2021  18:48   MR Brain W and Wo Contrast  Result Date: 04/27/2021 CLINICAL DATA:  Initial evaluation for brain CNS neoplasm. EXAM: MRI HEAD WITHOUT AND WITH CONTRAST TECHNIQUE: Multiplanar, multiecho pulse sequences of the brain and surrounding structures were obtained without and with intravenous contrast. CONTRAST:  11m GADAVIST GADOBUTROL 1 MMOL/ML IV SOLN COMPARISON:  Multiple previous CTs from 04/26/2021. FINDINGS: Brain: Cerebral volume within normal limits. No evidence for acute or subacute infarct. No acute or chronic intracranial hemorrhage. Intraparenchymal mass positioned along the gray-white matter junction at the posterior left frontoparietal region measures 1.9 x 1.5 x 1.9 cm (series 18, image 37). Surrounding T2/FLAIR signal intensity consistent with vasogenic edema. No midline shift. 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 No other mass lesion, abnormal enhancement, or visible intracranial metastatic disease. Ventricles normal size without hydrocephalus. No extra-axial fluid collection. Pituitary gland suprasellar region within normal limits. Vascular: Major intracranial vascular flow voids are maintained. Skull and upper cervical spine: Craniocervical junction within normal limits. Few scattered probable benign hemangiomata noted within the visualized upper cervical spine and about the skull base. No worrisome focal marrow replacing lesion. No scalp soft tissue abnormality. Sinuses/Orbits: Patient status post bilateral ocular lens replacement. Globes and orbital soft tissues demonstrate no acute finding. Mild mucosal thickening noted within the ethmoidal air cells and maxillary sinuses. Trace bilateral mastoid effusions noted, of doubtful significance. Inner ear structures grossly normal. Other: 2.1 x 2.3 cm heterogeneous mass noted within the right parapharyngeal space, better evaluated on prior neck CT. IMPRESSION: 1. 1.9 x 1.5 x 1.9 cm  intraparenchymal mass involving the posterior left frontoparietal region with surrounding vasogenic edema. 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 2. No other acute intracranial process. 3. 2.1 x 2.3 cm heterogeneous mass within the right parapharyngeal space, better evaluated on prior neck CT. Electronically Signed   By: BJeannine BogaM.D.   On: 04/27/2021 05:05   CT CHEST ABDOMEN PELVIS W CONTRAST  Result Date: 04/26/2021 CLINICAL DATA:  Right parapharyngeal mass, left temporal lobe mass, concern for metastatic disease EXAM: CT CHEST, ABDOMEN, AND PELVIS WITH CONTRAST TECHNIQUE: Multidetector CT imaging of the chest, abdomen and pelvis was performed following the standard protocol during bolus administration of intravenous contrast. CONTRAST:  1031mOMNIPAQUE IOHEXOL 300 MG/ML  SOLN COMPARISON:  None. FINDINGS: CT CHEST FINDINGS Cardiovascular: The heart is unremarkable without pericardial effusion. While this examination is not optimized for evaluation of the  pulmonary vasculature, I do not see any filling defects or pulmonary emboli. No evidence of thoracic aortic aneurysm or dissection. Moderate atherosclerosis throughout the aorta and coronary vasculature. Mediastinum/Nodes: Pathologic adenopathy within the mediastinum and hila. Precarinal lymph node measures up to 15 mm in short axis, reference image 29/3. Thyroid, trachea, and esophagus are unremarkable. Lungs/Pleura: There is a spiculated left upper lobe mass measuring 4.3 x 3.9 x 3.0 cm, consistent with primary lung cancer. Right hilar mass versus adenopathy identified, reference image 74/4, measuring 1.6 x 1.2 by 1.9 cm. This results in partial obstruction of right upper lobe segmental bronchi. No airspace disease, effusion, or pneumothorax. Musculoskeletal: There are no acute or destructive bony lesions. Reconstructed images demonstrate no additional findings. CT ABDOMEN PELVIS  FINDINGS Hepatobiliary: Small gallstones without cholecystitis. The liver is unremarkable. Pancreas: Unremarkable. No pancreatic ductal dilatation or surrounding inflammatory changes. Spleen: Normal in size without focal abnormality. Adrenals/Urinary Tract: Thickening of the medial limb of the left adrenal gland measuring up to 8 mm. No discrete right adrenal findings. Metastatic disease is not excluded. The kidneys enhance normally and symmetrically. Bladder is markedly distended, without filling defect. Stomach/Bowel: Nonspecific wall thickening of the gastric cardia measuring up to 2.1 cm. No bowel obstruction or ileus. Vascular/Lymphatic: 3.7 cm infrarenal abdominal aortic aneurysm, with extensive mural thrombus. Diffuse atherosclerosis. 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 No pathologic adenopathy within the abdomen or pelvis. Reproductive: Uterus and bilateral adnexa are unremarkable. Other: No free fluid or free gas.  No abdominal wall hernia. Musculoskeletal: No acute or destructive bony lesions. Reconstructed images demonstrate no additional findings. IMPRESSION: 1. Large spiculated left upper lobe mass consistent with primary lung cancer. 2. Right hilar mass versus adenopathy, with partial obstruction of segmental right upper lobe bronchi. 3. Mediastinal and hilar adenopathy consistent with nodal metastases. 4. Nonspecific left adrenal thickening, metastatic disease not excluded. 5. Nonspecific wall thickening of the gastric cardia. 6. 3.7 cm infrarenal abdominal aortic aneurysm. Recommend follow-up every 2 years. Reference: J Am Coll Radiol 2353;61:443-154. 7. 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 The bilateral profundus femoral arteries remain patent. 8. Cholelithiasis without cholecystitis. 9.  Aortic Atherosclerosis (ICD10-I70.0). Electronically Signed   By: Randa Ngo M.D.    On: 04/26/2021 18:52    Assessment and Plan:  1.  Lung mass, brain mass with vasogenic edema, lymphadenopathy concerning for metastatic malignancy 2.  Thrombocytosis, likely reactive 3.  Mild leukopenia 4.  Tobacco dependence 5.  AAA noted on CT scan 6.  Peripheral arterial disease noted on CT scan  -Discussed imaging findings with the patient and her 2 daughters who are at the bedside.  We discussed that imaging findings are concerning for metastatic malignancy, likely lung primary. -Recommend proceeding with EBUS per pulmonology to confirm diagnosis. -Has been seen by neurosurgery and is not a candidate for surgical resection.  Dr. Tammi Klippel of radiation oncology has been notified of need for consultation and will follow up with patient as an outpatient for [facility] treatment of her solitary brain met. -She will continue dexamethasone vasogenic edema which we taper by radiation oncology as an outpatient following SRS. -Discussed with patient and family that we will await biopsy results to discuss systemic treatment options. -Recommend PET scan as outpatient. -Recommend tobacco cessation. -We will arrange for outpatient follow-up at the cancer center to discuss the biopsy results and further systemic treatment recommendations.  The patient lives in [place], [place] and may be interested  in being seen at [facility] which is closer to her home.  Thank you for this referral.   Mikey Bussing, DNP, AGPCNP-BC, AOCNP   Addendum  I have seen the patient, examined her. I agree with the assessment and and plan and have edited the notes.   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  Tissue biopsy is needed, he is scheduled for bronchoscopy and biopsy tomorrow.  I have consulted radiation oncologist Dr. 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  Agree with steroids, tapering dose per Dr. Tammi Klippel.  Depending on the histology of biopsy, I will request Foundation One if this is adenocarcinoma. If biopsy sample is not adequate for genomic sequencing, I will obtain Guardant 360 liquid biopsy.  I discussed the incurable nature of her metastatic disease, and the overall treatment plan, which will be systemic chemotherapy, immunotherapy or targeted therapy after she completes brain radiation. All questions were answered. I plan to see her back next week in office for follow up. I believe Dr. Tammi Klippel plan to treat her in office next week, so she can be discharged home this weekend after biopsy.   Truitt Merle  04/27/2021

## 2021-04-28 ENCOUNTER — Encounter (HOSPITAL_COMMUNITY)
Admission: EM | Disposition: A | Discharge status: Home / Self Care | Payer: Self-pay | Source: Home / Self Care | Attending: Internal Medicine

## 2021-04-28 ENCOUNTER — Inpatient Hospital Stay (HOSPITAL_COMMUNITY): Payer: Medicare Other

## 2021-04-28 ENCOUNTER — Inpatient Hospital Stay (HOSPITAL_COMMUNITY): Payer: Medicare Other | Admitting: Anesthesiology

## 2021-04-28 ENCOUNTER — Encounter (HOSPITAL_COMMUNITY): Payer: Self-pay | Admitting: Internal Medicine

## 2021-04-28 ENCOUNTER — Other Ambulatory Visit: Payer: Self-pay | Admitting: Internal Medicine

## 2021-04-28 DIAGNOSIS — R918 Other nonspecific abnormal finding of lung field: Secondary | ICD-10-CM | POA: Diagnosis not present

## 2021-04-28 DIAGNOSIS — G9389 Other specified disorders of brain: Secondary | ICD-10-CM | POA: Diagnosis not present

## 2021-04-28 DIAGNOSIS — Z9889 Other specified postprocedural states: Secondary | ICD-10-CM

## 2021-04-28 HISTORY — PX: VIDEO BRONCHOSCOPY WITH ENDOBRONCHIAL ULTRASOUND: SHX6177

## 2021-04-28 HISTORY — PX: BRONCHIAL BIOPSY: SHX5109

## 2021-04-28 HISTORY — PX: BRONCHIAL BRUSHINGS: SHX5108

## 2021-04-28 HISTORY — PX: BRONCHIAL NEEDLE ASPIRATION BIOPSY: SHX5106

## 2021-04-28 HISTORY — PX: VIDEO BRONCHOSCOPY WITH RADIAL ENDOBRONCHIAL ULTRASOUND: SHX6849

## 2021-04-28 LAB — CBC WITH DIFFERENTIAL/PLATELET
Abs Immature Granulocytes: 0.07 10*3/uL (ref 0.00–0.07)
Basophils Absolute: 0 10*3/uL (ref 0.0–0.1)
Basophils Relative: 0 %
Eosinophils Absolute: 0 10*3/uL (ref 0.0–0.5)
Eosinophils Relative: 0 %
HCT: 37 % (ref 36.0–46.0)
Hemoglobin: 12.3 g/dL (ref 12.0–15.0)
Immature Granulocytes: 0 %
Lymphocytes Relative: 6 %
Lymphs Abs: 1.1 10*3/uL (ref 0.7–4.0)
MCH: 29.4 pg (ref 26.0–34.0)
MCHC: 33.2 g/dL (ref 30.0–36.0)
MCV: 88.3 fL (ref 80.0–100.0)
Monocytes Absolute: 0.5 10*3/uL (ref 0.1–1.0)
Monocytes Relative: 3 %
Neutro Abs: 16.5 10*3/uL — ABNORMAL HIGH (ref 1.7–7.7)
Neutrophils Relative %: 91 %
Platelets: 446 10*3/uL — ABNORMAL HIGH (ref 150–400)
RBC: 4.19 MIL/uL (ref 3.87–5.11)
RDW: 12.6 % (ref 11.5–15.5)
WBC: 18.2 10*3/uL — ABNORMAL HIGH (ref 4.0–10.5)
nRBC: 0 % (ref 0.0–0.2)

## 2021-04-28 LAB — HEPATIC FUNCTION PANEL
ALT: 10 U/L (ref 0–44)
AST: 15 U/L (ref 15–41)
Albumin: 3.1 g/dL — ABNORMAL LOW (ref 3.5–5.0)
Alkaline Phosphatase: 91 U/L (ref 38–126)
Bilirubin, Direct: <0.1 mg/dL (ref 0.0–0.2)
Total Bilirubin: 0.2 mg/dL — ABNORMAL LOW (ref 0.3–1.2)
Total Protein: 6.4 g/dL — ABNORMAL LOW (ref 6.5–8.1)

## 2021-04-28 LAB — SURGICAL PCR SCREEN
MRSA, PCR: NEGATIVE
Staphylococcus aureus: NEGATIVE

## 2021-04-28 IMAGING — CR DG CHEST 1V PORT
1 series · 1 of 1 positions shown · non-contrast
Comparison: CT chest [DATE]

CLINICAL DATA: Status post bronch

EXAM:
PORTABLE CHEST 1 VIEW

[AP]
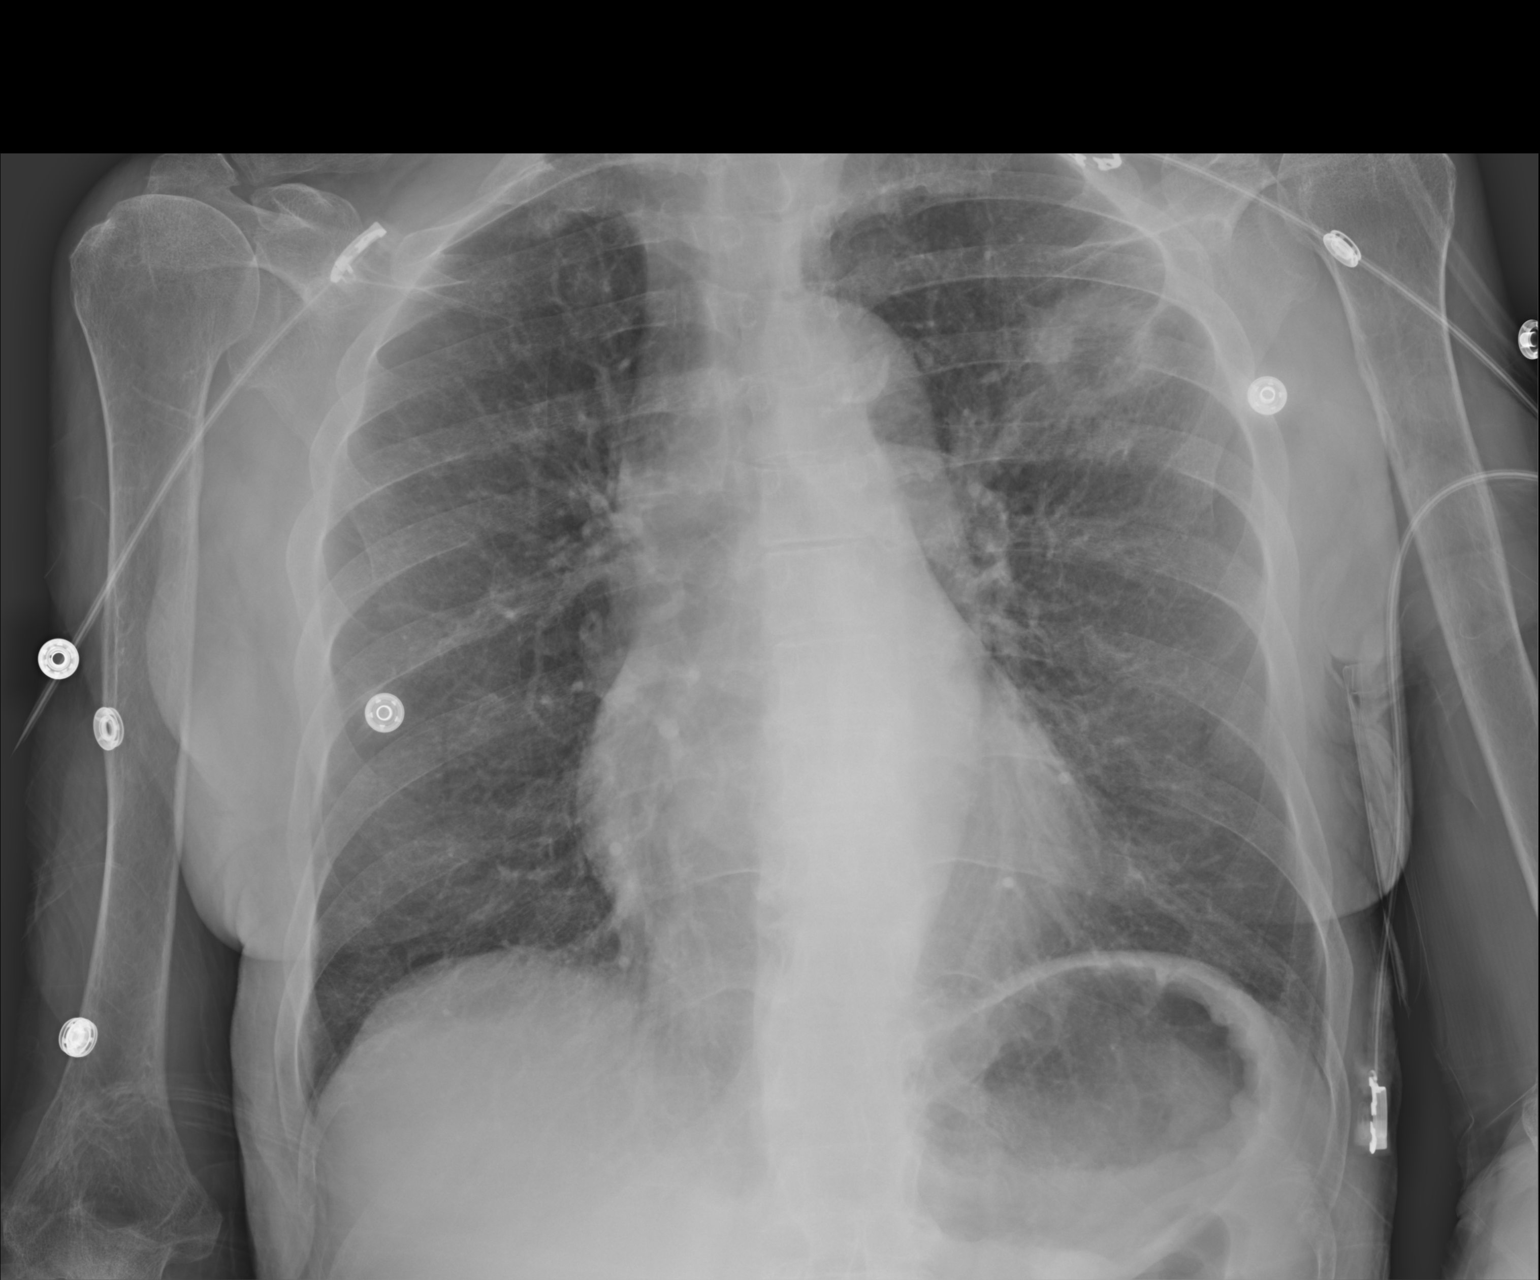

[1 of 1 positions shown; findings below may reference images not displayed]

FINDINGS: Cardiac silhouette and mediastinal contours are within normal limits
with calcification seen within the aortic arch. There is an opacity
overlying the left upper lung measuring up to approximately 5 cm
with spiculated margins corresponding to the mass seen on CT chest
concerning for primary lung cancer.

No pleural effusion or pneumothorax.

No acute skeletal abnormality.
IMPRESSION: Redemonstration of left upper lung spiculated mass concerning for
primary malignancy.

## 2021-04-28 SURGERY — BRONCHOSCOPY, WITH EBUS
Anesthesia: General

## 2021-04-28 MED ORDER — DEXAMETHASONE 4 MG PO TABS: 4.0000 mg | ORAL_TABLET | Freq: Four times a day (QID) | ORAL | 0 refills | Status: DC

## 2021-04-28 MED ORDER — GUAIFENESIN-DM 100-10 MG/5ML PO SYRP: 10.0000 mL | ORAL_SOLUTION | ORAL | 0 refills | Status: AC | PRN

## 2021-04-28 MED ORDER — FENTANYL CITRATE (PF) 250 MCG/5ML IJ SOLN
INTRAMUSCULAR | Status: DC | PRN
  Administered 2021-04-28 (×2): 50 ug via INTRAVENOUS

## 2021-04-28 MED ORDER — CHLORHEXIDINE GLUCONATE 0.12 % MT SOLN
OROMUCOSAL | Status: AC
  Filled 2021-04-28: qty 15

## 2021-04-28 MED ORDER — PANTOPRAZOLE SODIUM 40 MG PO TBEC: 40.0000 mg | DELAYED_RELEASE_TABLET | Freq: Every day | ORAL | 0 refills | Status: AC

## 2021-04-28 MED ORDER — LACTATED RINGERS IV SOLN: INTRAVENOUS | Status: DC

## 2021-04-28 MED ORDER — PROPOFOL 10 MG/ML IV BOLUS
INTRAVENOUS | Status: DC | PRN
  Administered 2021-04-28: 90 mg via INTRAVENOUS

## 2021-04-28 MED ORDER — LIDOCAINE 2% (20 MG/ML) 5 ML SYRINGE
INTRAMUSCULAR | Status: DC | PRN
  Administered 2021-04-28: 60 mg via INTRAVENOUS

## 2021-04-28 MED ORDER — DEXAMETHASONE SODIUM PHOSPHATE 10 MG/ML IJ SOLN
INTRAMUSCULAR | Status: DC | PRN
  Administered 2021-04-28: 10 mg via INTRAVENOUS

## 2021-04-28 MED ORDER — GUAIFENESIN-DM 100-10 MG/5ML PO SYRP: 10.0000 mL | ORAL_SOLUTION | ORAL | 0 refills | Status: DC | PRN

## 2021-04-28 MED ORDER — ONDANSETRON HCL 4 MG/2ML IJ SOLN
INTRAMUSCULAR | Status: DC | PRN
Start: 2021-04-28 — End: 2021-04-28
  Administered 2021-04-28: 4 mg via INTRAVENOUS

## 2021-04-28 MED ORDER — PHENYLEPHRINE HCL-NACL 20-0.9 MG/250ML-% IV SOLN
INTRAVENOUS | Status: DC | PRN
  Administered 2021-04-28: 15 ug/min via INTRAVENOUS

## 2021-04-28 MED ORDER — PANTOPRAZOLE SODIUM 40 MG PO TBEC: 40.0000 mg | DELAYED_RELEASE_TABLET | Freq: Every day | ORAL | 1 refills | Status: DC

## 2021-04-28 MED ORDER — ATORVASTATIN CALCIUM 40 MG PO TABS: 40.0000 mg | ORAL_TABLET | Freq: Every day | ORAL | Status: DC

## 2021-04-28 MED ORDER — ATORVASTATIN CALCIUM 40 MG PO TABS: 40.0000 mg | ORAL_TABLET | Freq: Every day | ORAL | 2 refills | Status: AC

## 2021-04-28 MED ORDER — ROCURONIUM BROMIDE 10 MG/ML (PF) SYRINGE
PREFILLED_SYRINGE | INTRAVENOUS | Status: DC | PRN
  Administered 2021-04-28: 50 mg via INTRAVENOUS

## 2021-04-28 MED ORDER — SUGAMMADEX SODIUM 200 MG/2ML IV SOLN
INTRAVENOUS | Status: DC | PRN
  Administered 2021-04-28: 200 mg via INTRAVENOUS

## 2021-04-28 MED ORDER — ATORVASTATIN CALCIUM 40 MG PO TABS: 40.0000 mg | ORAL_TABLET | Freq: Every day | ORAL | 2 refills | Status: DC

## 2021-04-28 SURGICAL SUPPLY — 30 items

## 2021-04-28 NOTE — Progress Notes (Signed)
Biopsy performed today by Dr. Valeta Harms; already plugged in with [place], [place], [facility].  PCCM available PRN.  Post bronch CXR looks good.  Erskine Emery MD PCCM

## 2021-04-28 NOTE — Anesthesia Postprocedure Evaluation (Signed)
Anesthesia Post Note  Patient: Cynthia Gonzalez  Procedure(s) Performed: VIDEO BRONCHOSCOPY WITH ENDOBRONCHIAL ULTRASOUND ROBOTIC ASSISTED NAVIGATIONAL BRONCHOSCOPY BRONCHIAL NEEDLE ASPIRATION BIOPSIES BRONCHIAL BRUSHINGS BRONCHIAL BIOPSIES VIDEO BRONCHOSCOPY WITH RADIAL ENDOBRONCHIAL ULTRASOUND     Patient location during evaluation: Endoscopy Anesthesia Type: General Level of consciousness: awake and alert Pain management: pain level controlled Vital Signs Assessment: post-procedure vital signs reviewed and stable Respiratory status: spontaneous breathing, nonlabored ventilation and respiratory function stable Cardiovascular status: blood pressure returned to baseline and stable Postop Assessment: no apparent nausea or vomiting Anesthetic complications: no   No notable events documented.  Last Vitals:  Vitals:   04/28/21 1340 04/28/21 1350  BP: (!) 143/51 (!) 145/66  Pulse: 80 71  Resp: 14 17  Temp:    SpO2: 97% 98%    Last Pain:  Vitals:   04/28/21 1350  TempSrc:   PainSc: 0-No pain                 Lidia Collum

## 2021-04-28 NOTE — Discharge Instructions (Signed)
Flexible Bronchoscopy, Care After This sheet gives you information about how to care for yourself after your test. Your doctor may also give you more specific instructions. If you have problems or questions, contact your doctor. Follow these instructions at home: Eating and drinking Do not eat or drink anything (not even water) for 2 hours after your test, or until your numbing medicine (local anesthetic) wears off. When your numbness is gone and your cough and gag reflexes have come back, you may: Eat only soft foods. Slowly drink liquids. The day after the test, go back to your normal diet. Driving Do not drive for 24 hours if you were given a medicine to help you relax (sedative). Do not drive or use heavy machinery while taking prescription pain medicine. General instructions  Take over-the-counter and prescription medicines only as told by your doctor. Return to your normal activities as told. Ask what activities are safe for you. Do not use any products that have nicotine or tobacco in them. This includes cigarettes and e-cigarettes. If you need help quitting, ask your doctor. Keep all follow-up visits as told by your doctor. This is important. It is very important if you had a tissue sample (biopsy) taken. Get help right away if: You have shortness of breath that gets worse. You get light-headed. You feel like you are going to pass out (faint). You have chest pain. You cough up: More than a little blood. More blood than before. Summary Do not eat or drink anything (not even water) for 2 hours after your test, or until your numbing medicine wears off. Do not use cigarettes. Do not use e-cigarettes. Get help right away if you have chest pain.  This information is not intended to replace advice given to you by your health care provider. Make sure you discuss any questions you have with your health care provider. Document Released: 02/04/2009 Document Revised: 03/22/2017 Document  Reviewed: 04/27/2016 Elsevier Patient Education  2020 [employer].

## 2021-04-28 NOTE — Plan of Care (Signed)
°  Problem: Education: Goal: Knowledge of General Education information will improve Description: Including pain rating scale, medication(s)/side effects and non-pharmacologic comfort measures Outcome: Progressing   Problem: Health Behavior/Discharge Planning: Goal: Ability to manage health-related needs will improve Outcome: Progressing   Problem: Clinical Measurements: Goal: Ability to maintain clinical measurements within normal limits will improve Outcome: Progressing Goal: Will remain free from infection Outcome: Progressing Goal: Diagnostic test results will improve Outcome: Progressing Goal: Respiratory complications will improve Outcome: Progressing Goal: Cardiovascular complication will be avoided Outcome: Progressing   Problem: Activity: Goal: Risk for activity intolerance will decrease Outcome: Progressing   Problem: Nutrition: Goal: Adequate nutrition will be maintained Outcome: Progressing   Problem: Coping: Goal: Level of anxiety will decrease Outcome: Progressing   Problem: Elimination: Goal: Will not experience complications related to bowel motility Outcome: Progressing Goal: Will not experience complications related to urinary retention Outcome: Progressing   Problem: Pain Managment: Goal: General experience of comfort will improve Outcome: Progressing   Problem: Safety: Goal: Ability to remain free from injury will improve Outcome: Progressing   Problem: Skin Integrity: Goal: Risk for impaired skin integrity will decrease Outcome: Progressing   Problem: Education: Goal: Knowledge of disease or condition will improve Outcome: Progressing Goal: Knowledge of secondary prevention will improve (SELECT ALL) Outcome: Progressing Goal: Knowledge of patient specific risk factors will improve (INDIVIDUALIZE FOR PATIENT) Outcome: Progressing Goal: Individualized Educational Video(s) Outcome: Progressing   Problem: Coping: Goal: Will verbalize  positive feelings about self Outcome: Progressing   Problem: Self-Care: Goal: Ability to participate in self-care as condition permits will improve Outcome: Progressing

## 2021-04-28 NOTE — Discharge Summary (Signed)
Physician Discharge Summary  Cynthia Gonzalez ZSW:[id number] DOB: 05-May-1950 DOA: 04/26/2021  PCP: Nolene Ebbs, MD  Admit date: 04/26/2021 Discharge date: 04/28/2021  Admitted From: Home Discharge disposition: Home   Code Status: Full Code   Discharge Diagnosis:   Principal Problem:   Brain mass Active Problems:   Lung mass   S/P bronchoscopy with biopsy    Chief Complaint  Patient presents with   Hand Problem   Brief narrative: Cynthia Gonzalez is a 71 y.o. female with PMH significant for chronic smoking who presented to the ED on 04/26/2021 with complaint of persistent weakness in the right upper extremity for last 3 weeks. In the ED, patient was afebrile, heart rate 102, blood pressure 152/78, breathing on room air. CT head showed 1.7 cm left frontal lobe mass with moderate vasogenic edema, minimal mass-effect.  It also showed partially visualized hypodense mass in the right neck parapharyngeal space. MRI brain confirmed the finding and suggested to be concerning for solitary intracranial metastasis. CT chest abdomen pelvis showed a large spiculated left lung upper lobe mass concerning for malignancy.  It also showed right hilar mass versus adenopathy with partial obstruction of segmental right upper bronchi, mediastinal and hilar adenopathy consistent with nodal metastasis CT soft tissue neck showed heterogeneously enhancing centrally hypodense mass in the right parapharyngeal space suspicious for an enlarged necrotic lymph node concerning for metastatic disease. Patient was admitted to hospitalist service Neurosurgery, IR consultation were obtained  Subjective: Patient was seen and examined this morning. Pleasant, elderly Caucasian female.  Propped up in bed.  Not in distress.  Continues to have right upper extremity weakness.  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  Suspected large left lung upper lobe mass concerning  for malignancy with associated hilar adenopathy, nodal metastasis, solitary brain metastasis. -IR neurosurgery and and pulmonary consulted. -Tentative plan of diagnostic EBUS under general anesthesia today.  Enlarged necrotic lymph node on right parapharyngeal space -Probably nodal metastasis of suspected lung cancer. -Per oncology, no need of ENT consultation for a separate biopsy at this time.  Left frontoparietal mass with vasogenic edema -Presented with right upper extremity weakness as a primary complaint -Consults obtained from neurosurgery, oncology and radiation oncology.  -Currently on dexamethasone.  Discussed with oncology and radiation oncology.  We will continue it for now, to be tapered off as an outpatient.  No need of seizure prophylaxis per radiation oncology. -Patient to follow-up as an outpatient to complete stereotactic radiosurgery treatment and PET staging.  Chronic smoking -Has been smoking half pack per day for several years -Counseled to stop smoking.  AAA -Imagings on admission also showed 3.7 cm infrarenal abdominal aortic aneurysm. -Probably related to chronic smoking.  Recommend follow-up every 2 years.  Peripheral artery disease  -Imagings on admission showed chronic appearing occlusion of the bilateral superficial femoral arteries. The bilateral profundus femoral arteries remain patent. -Peripheral artery disease is probably related to chronic smoking. -May benefit from aspirin and statin in the long-term.  I would hold off aspirin at this time because of the possible need of procedures.  Started on statin. Recent Labs  Lab 04/26/21 1608 04/27/21 0220 04/28/21 0109  AST 12* 12* 15  ALT 7 8 10   ALKPHOS 102 105 91  BILITOT 0.3 0.4 0.2*  PROT 7.6 6.7 6.4*  ALBUMIN 3.7 3.3* 3.1*   Mobility: Independent.  Encourage ambulation  Living condition: lives at home with daughter. Goals of care:   Code Status: Full  Code  Nutritional status: Body mass index  is 19.64 kg/m.      Discharge Medications:   Allergies as of 04/28/2021   No Known Allergies      Medication List     STOP taking these medications    ibuprofen 200 MG tablet Commonly known as: ADVIL       TAKE these medications    atorvastatin 40 MG tablet Commonly known as: LIPITOR Take 1 tablet (40 mg total) by mouth daily.   dexamethasone 4 MG tablet Commonly known as: Decadron Take 1 tablet (4 mg total) by mouth 4 (four) times daily.   guaiFENesin-dextromethorphan 100-10 MG/5ML syrup Commonly known as: ROBITUSSIN DM Take 10 mLs by mouth every 4 (four) hours as needed for cough.   pantoprazole 40 MG tablet Commonly known as: Protonix Take 1 tablet (40 mg total) by mouth daily.        Wound care:     Discharge Instructions:   Discharge Instructions     Call MD for:  difficulty breathing, headache or visual disturbances   Complete by: As directed    Call MD for:  extreme fatigue   Complete by: As directed    Call MD for:  hives   Complete by: As directed    Call MD for:  persistant dizziness or light-headedness   Complete by: As directed    Call MD for:  persistant nausea and vomiting   Complete by: As directed    Call MD for:  severe uncontrolled pain   Complete by: As directed    Call MD for:  temperature >100.4   Complete by: As directed    Diet general   Complete by: As directed    Discharge instructions   Complete by: As directed    General discharge instructions:  Follow with Primary MD Nolene Ebbs, MD in 7 days   Get CBC/BMP checked in next visit within 1 week by PCP or SNF MD. (We routinely change or add medications that can affect your baseline labs and fluid status, therefore we recommend that you get the mentioned basic workup next visit with your PCP, your PCP may decide not to get them or add new tests based on their clinical decision)  On your next visit with your PCP, please get your medicines reviewed and  adjusted.  Please request your PCP  to go over all hospital tests, procedures, radiology results at the follow up, please get all Hospital records sent to your PCP by signing hospital release before you go home.  Activity: As tolerated with Full fall precautions use walker/cane & assistance as needed  Avoid using any recreational substances like cigarette, tobacco, alcohol, or non-prescribed drug.  If you experience worsening of your admission symptoms, develop shortness of breath, life threatening emergency, suicidal or homicidal thoughts you must seek medical attention immediately by calling 911 or calling your MD immediately  if symptoms less severe.  You must read complete instructions/literature along with all the possible adverse reactions/side effects for all the medicines you take and that have been prescribed to you. Take any new medicine only after you have completely understood and accepted all the possible adverse reactions/side effects.   Do not drive, operate heavy machinery, perform activities at heights, swimming or participation in water activities or provide baby sitting services if your were admitted for syncope or siezures until you have seen by Primary MD or a Neurologist and advised to do so again.  Do not drive when taking  Pain medications.  Do not take more than prescribed Pain, Sleep and Anxiety Medications  Wear Seat belts while driving.  Please note You were cared for by a hospitalist during your hospital stay. If you have any questions about your discharge medications or the care you received while you were in the hospital after you are discharged, you can call the unit and asked to speak with the hospitalist on call if the hospitalist that took care of you is not available. Once you are discharged, your primary care physician will handle any further medical issues. Please note that NO REFILLS for any discharge medications will be authorized once you are discharged, as  it is imperative that you return to your primary care physician (or establish a relationship with a primary care physician if you do not have one) for your aftercare needs so that they can reassess your need for medications and monitor your lab values.   Increase activity slowly   Complete by: As directed        Follow ups:    Follow-up Information     Icard, Bradley L, DO. Schedule an appointment as soon as possible for a visit.   Specialty: Pulmonary Disease Contact information: [place] [id number] [phone]         Nolene Ebbs, MD Follow up.   Specialty: Internal Medicine Contact information: [place] [address] [phone]                 Discharge Exam:   Vitals:   04/28/21 1330 04/28/21 1340 04/28/21 1350 04/28/21 1423  BP: (!) 141/48 (!) 143/51 (!) 145/66 (!) 146/68  Pulse: 80 80 71 71  Resp: (!) 24 14 17 14   Temp:    97.8 F (36.6 C)  TempSrc:    Oral  SpO2: 94% 97% 98% 97%  Weight:      Height:        Body mass index is 19.64 kg/m.   General exam: Pleasant, elderly Caucasian female.  Not in physical distress Skin: No rashes, lesions or ulcers. HEENT: Atraumatic, normocephalic, no obvious bleeding Lungs: Clear to auscultation bilaterally CVS: Regular rate and rhythm, no murmur GI/Abd soft, nontender, nondistended, bowel sound present CNS: Alert, awake oriented x3, right upper extremity weakness persists Psychiatry: Mood appropriate Extremities: No pedal edema, no calf tenderness  Time coordinating discharge: 35 minutes   The results of significant diagnostics from this hospitalization (including imaging, microbiology, ancillary and laboratory) are listed below for reference.    Procedures and Diagnostic Studies:   MR BRAIN W WO CONTRAST  Result Date: 04/27/2021 CLINICAL DATA:  Metastatic disease evaluation Treatment planning for possible SRS EXAM: MRI HEAD WITHOUT AND WITH CONTRAST  TECHNIQUE: Multiplanar, multiecho pulse sequences of the brain and surrounding structures were obtained without and with intravenous contrast. CONTRAST:  5.76mL GADAVIST GADOBUTROL 1 MMOL/ML IV SOLN COMPARISON:  MRI from the same day. FINDINGS: Brain: Unchanged 1.9 cm enhancing lesion within the left frontoparietal region, characterized on same day MRI. Similar surrounding edema without midline shift. No other enhancing intraparenchymal lesions identified. No evidence of hydrocephalus, acute hemorrhage, acute infarct, or extra-axial fluid collection. Right parapharyngeal space mass is T2 hyperintense and heterogeneously enhancing, further characterized on same day CT. Vascular: Major arterial flow voids are maintained at the skull base. Skull and upper cervical spine: Similar probable benign vertebral venous malformations in the upper cervical spine, which are T1 hyperintense. Sinuses/Orbits: Mild paranasal sinus mucosal thickening. Unremarkable orbits.  Other: No sizable mastoid effusions. IMPRESSION: 1. Redemonstrated 1.9 cm enhancing intraparenchymal mass in the posterior left frontoparietal region with surrounding vasogenic edema, concerning for an intracranial metastasis given findings on recent CT chest. 2. Partially imaged right parapharyngeal neck mass, further characterized on same day CT neck. This could represent a nodal metastasis or primary malignancy such as a salivary gland neoplasm or neurogenic tumor. Electronically Signed   By: Margaretha Sheffield M.D.   On: 04/27/2021 17:17   MR Brain W and Wo Contrast  Result Date: 04/27/2021 CLINICAL DATA:  Initial evaluation for brain CNS neoplasm. EXAM: MRI HEAD WITHOUT AND WITH CONTRAST TECHNIQUE: Multiplanar, multiecho pulse sequences of the brain and surrounding structures were obtained without and with intravenous contrast. CONTRAST:  54mL GADAVIST GADOBUTROL 1 MMOL/ML IV SOLN COMPARISON:  Multiple previous CTs from 04/26/2021. FINDINGS: Brain: Cerebral  volume within normal limits. No evidence for acute or subacute infarct. No acute or chronic intracranial hemorrhage. Intraparenchymal mass positioned along the gray-white matter junction at the posterior left frontoparietal region measures 1.9 x 1.5 x 1.9 cm (series 18, image 37). Surrounding T2/FLAIR signal intensity consistent with vasogenic edema. No midline shift. 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 No other mass lesion, abnormal enhancement, or visible intracranial metastatic disease. Ventricles normal size without hydrocephalus. No extra-axial fluid collection. Pituitary gland suprasellar region within normal limits. Vascular: Major intracranial vascular flow voids are maintained. Skull and upper cervical spine: Craniocervical junction within normal limits. Few scattered probable benign hemangiomata noted within the visualized upper cervical spine and about the skull base. No worrisome focal marrow replacing lesion. No scalp soft tissue abnormality. Sinuses/Orbits: Patient status post bilateral ocular lens replacement. Globes and orbital soft tissues demonstrate no acute finding. Mild mucosal thickening noted within the ethmoidal air cells and maxillary sinuses. Trace bilateral mastoid effusions noted, of doubtful significance. Inner ear structures grossly normal. Other: 2.1 x 2.3 cm heterogeneous mass noted within the right parapharyngeal space, better evaluated on prior neck CT. IMPRESSION: 1. 1.9 x 1.5 x 1.9 cm intraparenchymal mass involving the posterior left frontoparietal region with surrounding vasogenic edema. 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 2. No other acute intracranial process. 3. 2.1 x 2.3 cm heterogeneous mass within the right parapharyngeal space, better evaluated on prior neck CT. Electronically Signed   By: Jeannine Boga M.D.   On: 04/27/2021 05:05   DG  Chest Port 1 View  Result Date: 04/28/2021 CLINICAL DATA:  Status post bronch EXAM: PORTABLE CHEST 1 VIEW COMPARISON:  CT chest 04/26/2021 FINDINGS: Cardiac silhouette and mediastinal contours are within normal limits with calcification seen within the aortic arch. There is an opacity overlying the left upper lung measuring up to approximately 5 cm with spiculated margins corresponding to the mass seen on CT chest concerning for primary lung cancer. No pleural effusion or pneumothorax. No acute skeletal abnormality. IMPRESSION: Redemonstration of left upper lung spiculated mass concerning for primary malignancy. Electronically Signed   By: Yvonne Kendall   On: 04/28/2021 13:52   DG C-ARM BRONCHOSCOPY  Result Date: 04/28/2021 C-ARM BRONCHOSCOPY: Fluoroscopy was utilized by the requesting physician.  No radiographic interpretation.     Labs:   Basic Metabolic Panel: Recent Labs  Lab 04/26/21 1608 04/27/21 0220  NA 135 135  K 3.8 4.2  CL 101 105  CO2 24 21*  GLUCOSE 78 171*  BUN 10 9  CREATININE 0.79 0.68  CALCIUM 9.0 9.5   GFR Estimated Creatinine Clearance: 53.7 mL/min (by C-G formula based on SCr of 0.68 mg/dL). Liver Function Tests: Recent Labs  Lab 04/26/21 1608 04/27/21 0220 04/28/21 0109  AST 12* 12* 15  ALT 7 8 10   ALKPHOS 102 105 91  BILITOT 0.3 0.4 0.2*  PROT 7.6 6.7 6.4*  ALBUMIN 3.7 3.3* 3.1*   No results for input(s): LIPASE, AMYLASE in the last 168 hours. No results for input(s): AMMONIA in the last 168 hours. Coagulation profile Recent Labs  Lab 04/26/21 1608  INR 0.9    CBC: Recent Labs  Lab 04/26/21 1608 04/27/21 0220 04/28/21 0109  WBC 6.9 3.3* 18.2*  NEUTROABS 4.4  --  16.5*  HGB 14.0 13.1 12.3  HCT 41.6 38.7 37.0  MCV 88.1 87.8 88.3  PLT 461* 418* 446*   Cardiac Enzymes: No results for input(s): CKTOTAL, CKMB, CKMBINDEX, TROPONINI in the last 168 hours. BNP: Invalid input(s): POCBNP CBG: No results for input(s): GLUCAP in the last 168  hours. D-Dimer No results for input(s): DDIMER in the last 72 hours. Hgb A1c No results for input(s): HGBA1C in the last 72 hours. Lipid Profile No results for input(s): CHOL, HDL, LDLCALC, TRIG, CHOLHDL, LDLDIRECT in the last 72 hours. Thyroid function studies Recent Labs    04/27/21 0220  TSH 0.367   Anemia work up No results for input(s): VITAMINB12, FOLATE, FERRITIN, TIBC, IRON, RETICCTPCT in the last 72 hours. Microbiology Recent Results (from the past 240 hour(s))  Resp Panel by RT-PCR (Flu A&B, Covid) Nasopharyngeal Swab     Status: None   Collection Time: 04/26/21  4:08 PM   Specimen: Nasopharyngeal Swab; Nasopharyngeal(NP) swabs in vial transport medium  Result Value Ref Range Status   SARS Coronavirus 2 by RT PCR NEGATIVE NEGATIVE Final    Comment: (NOTE) SARS-CoV-2 target nucleic acids are NOT DETECTED.  The SARS-CoV-2 RNA is generally detectable in upper respiratory specimens during the acute phase of infection. The lowest concentration of SARS-CoV-2 viral copies this assay can detect is 138 copies/mL. A negative result does not preclude SARS-Cov-2 infection and should not be used as the sole basis for treatment or other patient management decisions. A negative result may occur with  improper specimen collection/handling, submission of specimen other than nasopharyngeal swab, presence of viral mutation(s) within the areas targeted by this assay, and inadequate number of viral copies(<138 copies/mL). A negative result must be combined with clinical observations, patient history, and epidemiological information. The expected result is Negative.  Fact Sheet for Patients:  EntrepreneurPulse.com.au  Fact Sheet for Healthcare Providers:  IncredibleEmployment.be  This test is no t yet approved or cleared by the [place] FDA and  has been authorized for detection and/or diagnosis of SARS-CoV-2 by FDA under an Emergency Use  Authorization (EUA). This EUA will remain  in effect (meaning this test can be used) for the duration of the COVID-19 declaration under Section 564(b)(1) of the Act, 21 U.S.C.section 360bbb-3(b)(1), unless the authorization is terminated  or revoked sooner.       Influenza A by PCR NEGATIVE NEGATIVE Final   Influenza B by PCR NEGATIVE NEGATIVE Final    Comment: (NOTE) The Xpert Xpress SARS-CoV-2/FLU/RSV plus assay is intended as an aid in the diagnosis of influenza from Nasopharyngeal swab specimens and should not be used as a sole basis for treatment. Nasal washings and aspirates are unacceptable for Xpert Xpress SARS-CoV-2/FLU/RSV testing.  Fact Sheet for Patients: EntrepreneurPulse.com.au  Fact Sheet for Healthcare Providers: IncredibleEmployment.be  This test is not yet approved or cleared by the [place] and has been authorized for detection and/or diagnosis of SARS-CoV-2 by FDA under an Emergency Use Authorization (EUA). This EUA will remain in effect (meaning this test can be used) for the duration of the COVID-19 declaration under Section 564(b)(1) of the Act, 21 U.S.C. section 360bbb-3(b)(1), unless the authorization is terminated or revoked.  Performed at [facility], [address] [place], [place] [address]   Surgical pcr screen     Status: None   Collection Time: 04/27/21 11:27 PM   Specimen: Nasal Mucosa; Nasal Swab  Result Value Ref Range Status   MRSA, PCR NEGATIVE NEGATIVE Final   Staphylococcus aureus NEGATIVE NEGATIVE Final    Comment: (NOTE) The Xpert SA Assay (FDA approved for NASAL specimens in patients 78 years of age and older), is one component of a comprehensive surveillance program. It is not intended to diagnose infection nor to guide or monitor treatment. Performed at [facility], [place] [address], [place], [place] [address]      Signed: Terrilee Croak  [facility] 04/28/2021, 4:11 PM

## 2021-04-28 NOTE — Interval H&P Note (Signed)
History and Physical Interval Note:  04/28/2021 11:33 AM  Cynthia Gonzalez  has presented today for surgery, with the diagnosis of lung mass.  The various methods of treatment have been discussed with the patient and family. After consideration of risks, benefits and other options for treatment, the patient has consented to  Procedure(s): VIDEO BRONCHOSCOPY WITH ENDOBRONCHIAL ULTRASOUND (N/A) ROBOTIC ASSISTED NAVIGATIONAL BRONCHOSCOPY (N/A) as a surgical intervention.  The patient's history has been reviewed, patient examined, no change in status, stable for surgery.  I have reviewed the patient's chart and labs.  Questions were answered to the patient's satisfaction.     [place]

## 2021-04-28 NOTE — Anesthesia Preprocedure Evaluation (Signed)
Anesthesia Evaluation  Patient identified by MRN, date of birth, ID band Patient awake    Reviewed: Allergy & Precautions, NPO status , Patient's Chart, lab work & pertinent test results  History of Anesthesia Complications Negative for: history of anesthetic complications  Airway Mallampati: II  TM Distance: >3 FB Neck ROM: Full    Dental  (+) Edentulous Upper, Edentulous Lower   Pulmonary Current Smoker,  Lung mass   Pulmonary exam normal        Cardiovascular Normal cardiovascular exam  HLD   Neuro/Psych Brain metastasis    GI/Hepatic negative GI ROS, Neg liver ROS,   Endo/Other  negative endocrine ROS  Renal/GU negative Renal ROS  negative genitourinary   Musculoskeletal negative musculoskeletal ROS (+)   Abdominal   Peds  Hematology negative hematology ROS (+)   Anesthesia Other Findings   Reproductive/Obstetrics                            Anesthesia Physical Anesthesia Plan  ASA: 3  Anesthesia Plan: General   Post-op Pain Management: Minimal or no pain anticipated   Induction: Intravenous  PONV Risk Score and Plan: 2 and Ondansetron, Dexamethasone, Treatment may vary due to age or medical condition and Midazolam  Airway Management Planned: Oral ETT  Additional Equipment: None  Intra-op Plan:   Post-operative Plan: Extubation in OR  Informed Consent: I have reviewed the patients History and Physical, chart, labs and discussed the procedure including the risks, benefits and alternatives for the proposed anesthesia with the patient or authorized representative who has indicated his/her understanding and acceptance.     Dental advisory given  Plan Discussed with:   Anesthesia Plan Comments:         Anesthesia Quick Evaluation

## 2021-04-28 NOTE — Op Note (Signed)
Video Bronchoscopy with Robotic Assisted Bronchoscopic Navigation  Video Bronchoscopy with Endobronchial Ultrasound Procedure Note  Date of Operation: 04/28/2021   Pre-op Diagnosis: Lung mass, adenopathy  Post-op Diagnosis: Lung mass, adenopathy  Surgeon: Garner Nash, DO   Assistants: None   Anesthesia: General endotracheal anesthesia  Operation: Flexible video fiberoptic bronchoscopy with robotic assistance and biopsies.  Estimated Blood Loss: Minimal  Complications: None  Indications and History: Cynthia Gonzalez is a 71 y.o. female with history of brain mass, lung mass, adenopathy concerning for an advanced stage lung cancer. The risks, benefits, complications, treatment options and expected outcomes were discussed with the patient.  The possibilities of pneumothorax, pneumonia, reaction to medication, pulmonary aspiration, perforation of a viscus, bleeding, failure to diagnose a condition and creating a complication requiring transfusion or operation were discussed with the patient who freely signed the consent.    Description of Procedure: The patient was seen in the Preoperative Area, was examined and was deemed appropriate to proceed.  The patient was taken to [facility] endoscopy room 3, identified as Cynthia Gonzalez and the procedure verified as Flexible Video Fiberoptic Bronchoscopy.  A Time Out was held and the above information confirmed.   Prior to the date of the procedure a high-resolution CT scan of the chest was performed. Utilizing ION software program a virtual tracheobronchial tree was generated to allow the creation of distinct navigation pathways to the patient's parenchymal abnormalities. After being taken to the operating room general anesthesia was initiated and the patient  was orally intubated. The video fiberoptic bronchoscope was introduced via the endotracheal tube and a general inspection was performed which showed normal right and left lung anatomy, aspiration of  the bilateral mainstems was completed to remove any remaining secretions. Robotic catheter inserted into patient's endotracheal tube.   Target #1 left upper lobe: The distinct navigation pathways prepared prior to this procedure were then utilized to navigate to patient's lesion identified on CT scan. The robotic catheter was secured into place and the vision probe was withdrawn.  Lesion location was approximated using fluoroscopy and radial endobronchial ultrasound for peripheral targeting. Under fluoroscopic guidance transbronchial needle brushings, transbronchial needle biopsies, and transbronchial forceps biopsies were performed to be sent for cytology and pathology.  Target #2 right hilar node, 11R: The standard scope was then withdrawn and the endobronchial ultrasound was used to identify and characterize the peritracheal, hilar and bronchial lymph nodes. Inspection showed small pretracheal and right-sided hilar adenopathy. Using real-time ultrasound guidance Wang needle biopsies were take from Station 11R nodes and were sent for cytology.   At the end of the procedure a general airway inspection was performed and there was no evidence of active bleeding. The bronchoscope was removed.  The patient tolerated the procedure well. There was no significant blood loss and there were no obvious complications. A post-procedural chest x-ray is pending.  Samples Target #1: 1. Transbronchial needle brushings from LUL 2. Transbronchial Wang needle biopsies from LUL 3. Transbronchial forceps biopsies from LUL  Samples Target #2: 1. Wang needle biopsies from 11R node  Plans:  The patient will be discharged from the PACU to home when recovered from anesthesia and after chest x-ray is reviewed. We will review the cytology, pathology and microbiology results with the patient when they become available. Outpatient followup will be with Garner Nash, DO.   Garner Nash, DO [facility] 04/28/2021 1:07 PM

## 2021-04-28 NOTE — TOC Transition Note (Signed)
Transition of Care [facility]) - CM/SW Discharge Note   Patient Details  Name: Cynthia Gonzalez MRN: [id number] Date of Birth: 08-12-1950  Transition of Care [facility]) CM/SW Contact:  Pollie Friar, RN Phone Number: 04/28/2021, 4:38 PM   Clinical Narrative:    Pt is discharging home with outpatient therapy through [facility] therapy. Orders faxed to: [phone] Pt has supervision at home. She denies any issues with transportation.  Pt has transport home today.   Final next level of care: OP Rehab Barriers to Discharge: No Barriers Identified   Patient Goals and CMS Choice   CMS Medicare.gov Compare Post Acute Care list provided to:: Patient    Discharge Placement                       Discharge Plan and Services                                     Social Determinants of Health (SDOH) Interventions     Readmission Risk Interventions No flowsheet data found.

## 2021-04-28 NOTE — Anesthesia Procedure Notes (Signed)
Procedure Name: Intubation Date/Time: 04/28/2021 12:09 PM Performed by: Kyung Rudd, CRNA Pre-anesthesia Checklist: Patient identified, Emergency Drugs available, Suction available and Patient being monitored Patient Re-evaluated:Patient Re-evaluated prior to induction Oxygen Delivery Method: Circle system utilized Preoxygenation: Pre-oxygenation with 100% oxygen Induction Type: IV induction Ventilation: Mask ventilation without difficulty and Oral airway inserted - appropriate to patient size Laryngoscope Size: Mac and 3 Grade View: Grade I Tube type: Oral Tube size: 8.5 mm Number of attempts: 1 Airway Equipment and Method: Stylet Placement Confirmation: ETT inserted through vocal cords under direct vision, positive ETCO2 and breath sounds checked- equal and bilateral Secured at: 21 cm Tube secured with: Tape Dental Injury: Teeth and Oropharynx as per pre-operative assessment

## 2021-04-28 NOTE — Progress Notes (Signed)
Prescriptions sent again to a different pharmacy at patient's request [facility] [address], [facility]

## 2021-04-28 NOTE — Progress Notes (Signed)
PROGRESS NOTE  Cynthia Gonzalez  DOB: Aug 17, 1950  PCP: Nolene Ebbs, MD CHE:[id number]  DOA: 04/26/2021  LOS: 2 days  Hospital Day: 3  Chief Complaint  Patient presents with   Hand Problem   Brief narrative: Cynthia Gonzalez is a 71 y.o. female with PMH significant for chronic smoking who presented to the ED on 04/26/2021 with complaint of persistent weakness in the right upper extremity for last 3 weeks. In the ED, patient was afebrile, heart rate 102, blood pressure 152/78, breathing on room air. CT head showed 1.7 cm left frontal lobe mass with moderate vasogenic edema, minimal mass-effect.  It also showed partially visualized hypodense mass in the right neck parapharyngeal space. MRI brain confirmed the finding and suggested to be concerning for solitary intracranial metastasis. CT chest abdomen pelvis showed a large spiculated left lung upper lobe mass concerning for malignancy.  It also showed right hilar mass versus adenopathy with partial obstruction of segmental right upper bronchi, mediastinal and hilar adenopathy consistent with nodal metastasis CT soft tissue neck showed heterogeneously enhancing centrally hypodense mass in the right parapharyngeal space suspicious for an enlarged necrotic lymph node concerning for metastatic disease. Patient was admitted to hospitalist service Neurosurgery, IR consultation were obtained  Subjective: Patient was seen and examined this morning. Pleasant, elderly Caucasian female.  Propped up in bed.  Not in distress.  Continues to have right upper extremity weakness. No other symptoms. I updated the patient and her daughter about the scan findings of potential metastatic cancer and further work-up plan.  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  Suspected large left lung upper lobe mass concerning for malignancy with associated hilar adenopathy, nodal metastasis, solitary brain  metastasis. -IR neurosurgery and and pulmonary consulted. -Tentative plan of diagnostic EBUS under general anesthesia today.  Enlarged necrotic lymph node on right parapharyngeal space -Probably nodal metastasis of suspected lung cancer. -Per oncology, no need of ENT consultation for a separate biopsy at this time.  Left frontoparietal mass with vasogenic edema -Presented with right upper extremity weakness as a primary complaint -Consults obtained from neurosurgery, oncology and radiation oncology.  -Currently on dexamethasone.  -Tentative plan of outpatient follow-up to complete stereotactic radiosurgery treatment and PET staging.  Chronic smoking -Has been smoking half pack per day for several years -Counseled to stop smoking.  AAA -Imagings on admission also showed 3.7 cm infrarenal abdominal aortic aneurysm. -Probably related to chronic smoking.  Recommend follow-up every 2 years.  Peripheral artery disease  -Imagings on admission showed chronic appearing occlusion of the bilateral superficial femoral arteries. The bilateral profundus femoral arteries remain patent. -Peripheral artery disease is probably related to chronic smoking. -May benefit from aspirin and statin in the long-term.  I would hold off aspirin at this time because of the possible need of procedures.  Start statin. Recent Labs  Lab 04/26/21 1608 04/27/21 0220 04/28/21 0109  AST 12* 12* 15  ALT 7 8 10   ALKPHOS 102 105 91  BILITOT 0.3 0.4 0.2*  PROT 7.6 6.7 6.4*  ALBUMIN 3.7 3.3* 3.1*    Mobility: Independent.  Encourage ambulation  Living condition: lives at home with daughter. Goals of care:   Code Status: Full Code  Nutritional status: Body mass index is 19.64 kg/m.      Diet:  Diet Order             Diet NPO time specified  Diet effective midnight  DVT prophylaxis:  SCDs Start: 04/27/21 0210   Antimicrobials: None Fluid: None Consultants: Neurosurgery, IR,  pulmonology, oncology, radiation oncology Family Communication: Talked to daughter at bedside  Status is: Inpatient  Continue in-hospital care because: Needs work-up for malignancy Level of care: Telemetry Medical   Dispo: The patient is from: Home              Anticipated d/c is to: Home              Patient currently is not medically stable to d/c.   Difficult to place patient No     Infusions:    Scheduled Meds:  dexamethasone (DECADRON) injection  4 mg Intravenous Q6H   influenza vaccine adjuvanted  0.5 mL Intramuscular Tomorrow-1000    PRN meds: acetaminophen **OR** acetaminophen, guaiFENesin-dextromethorphan   Antimicrobials: Anti-infectives (From admission, onward)    None       Objective: Vitals:   04/28/21 0412 04/28/21 0855  BP: (!) 103/46 119/62  Pulse: 65 68  Resp: 17 20  Temp: 98 F (36.7 C) 97.6 F (36.4 C)  SpO2: 96% 95%    Intake/Output Summary (Last 24 hours) at 04/28/2021 1036 Last data filed at 04/28/2021 0900 Gross per 24 hour  Intake 240 ml  Output --  Net 240 ml   Filed Weights   04/26/21 1348  Weight: 53.5 kg   Weight change:  Body mass index is 19.64 kg/m.   Physical Exam: General exam: Pleasant, elderly Caucasian female.  Not in physical distress Skin: No rashes, lesions or ulcers. HEENT: Atraumatic, normocephalic, no obvious bleeding Lungs: Clear to auscultation bilaterally CVS: Regular rate and rhythm, no murmur GI/Abd soft, nontender, nondistended, bowel sound present CNS: Alert, awake oriented x3, right upper extremity weakness persists Psychiatry: Mood appropriate Extremities: No pedal edema, no calf tenderness  Data Review: I have personally reviewed the laboratory data and studies available.  F/u labs ordered Unresulted Labs (From admission, onward)    None       Signed, Terrilee Croak, MD [facility] 04/28/2021

## 2021-04-28 NOTE — Transfer of Care (Signed)
Immediate Anesthesia Transfer of Care Note  Patient: Cynthia Gonzalez  Procedure(s) Performed: VIDEO BRONCHOSCOPY WITH ENDOBRONCHIAL ULTRASOUND ROBOTIC ASSISTED NAVIGATIONAL BRONCHOSCOPY BRONCHIAL NEEDLE ASPIRATION BIOPSIES BRONCHIAL BRUSHINGS BRONCHIAL BIOPSIES VIDEO BRONCHOSCOPY WITH RADIAL ENDOBRONCHIAL ULTRASOUND  Patient Location: Endoscopy Unit  Anesthesia Type:General  Level of Consciousness: awake, alert  and oriented  Airway & Oxygen Therapy: Patient Spontanous Breathing and Patient connected to face mask oxygen  Post-op Assessment: Report given to RN, Post -op Vital signs reviewed and stable and Patient moving all extremities X 4  Post vital signs: Reviewed and stable  Last Vitals:  Vitals Value Taken Time  BP 180/76 04/28/21 1320  Temp 36.6 C 04/28/21 1320  Pulse 87 04/28/21 1322  Resp 15 04/28/21 1322  SpO2 95 % 04/28/21 1322  Vitals shown include unvalidated device data.  Last Pain:  Vitals:   04/28/21 1320  TempSrc: Temporal  PainSc: 0-No pain         Complications: No notable events documented.

## 2021-04-28 NOTE — Evaluation (Signed)
Occupational Therapy Evaluation Patient Details Name: Cynthia Gonzalez MRN: [id number] DOB: Jun 10, 1950 Today's Date: 04/28/2021   History of Present Illness 71 y.o. female with PMH significant for chronic smoking who presented to the ED on 04/26/2021 with complaint of persistent weakness in the right upper extremity for last 3 weeks. CT shows L frontal lob mass, as well as L lung upper lobe mass with concerns for malignancy.   Clinical Impression   Pt admitted for concerns listed above. PTA pt reported that she was independent with all ADL's and IADL's. At this time, pt continues to demonstrate independence with all ADL's and functional mobility. Her RUE is limited by weakness and incoordination, however with compensatory strategies and use of her LUE she is safe completing most tasks. Recommending OP OT for her RUE. Pt has no further acute OT needs and acute OT will sign off.       Recommendations for follow up therapy are one component of a multi-disciplinary discharge planning process, led by the attending physician.  Recommendations may be updated based on patient status, additional functional criteria and insurance authorization.   Follow Up Recommendations  Outpatient OT    Assistance Recommended at Discharge PRN  Patient can return home with the following      Functional Status Assessment  Patient has had a recent decline in their functional status and demonstrates the ability to make significant improvements in function in a reasonable and predictable amount of time.  Equipment Recommendations  None recommended by OT    Recommendations for Other Services       Precautions / Restrictions Precautions Precautions: None Restrictions Weight Bearing Restrictions: No      Mobility Bed Mobility Overal bed mobility: Modified Independent                  Transfers Overall transfer level: Modified independent Equipment used: None                      Balance                                            ADL either performed or assessed with clinical judgement   ADL Overall ADL's : Modified independent                                       General ADL Comments: Pt able to complete BADL and functionalmobility with no physical assist, however requiring increased time and problem solving due to RUE     Vision Baseline Vision/History: 1 Wears glasses Ability to See in Adequate Light: 0 Adequate Patient Visual Report: No change from baseline Vision Assessment?: No apparent visual deficits     Perception     Praxis      Pertinent Vitals/Pain Pain Assessment: No/denies pain     Hand Dominance Right   Extremity/Trunk Assessment Upper Extremity Assessment Upper Extremity Assessment: RUE deficits/detail RUE Deficits / Details: ROM and strength in elbow and shoulder WFL, hand incoordination, weakness, and numbness RUE Sensation: decreased proprioception;decreased light touch RUE Coordination: decreased fine motor   Lower Extremity Assessment Lower Extremity Assessment: Overall WFL for tasks assessed   Cervical / Trunk Assessment Cervical / Trunk Assessment: Normal   Communication Communication Communication: No difficulties   Cognition Arousal/Alertness: Awake/alert  Behavior During Therapy: WFL for tasks assessed/performed Overall Cognitive Status: Within Functional Limits for tasks assessed                                       General Comments  VSS on RA    Exercises     Shoulder Instructions      Home Living Family/patient expects to be discharged to:: Private residence Living Arrangements: Children Available Help at Discharge: Family;Available 24 hours/day Type of Home: House Home Access: Stairs to enter [employer] of Steps: 4 steps   Home Layout: One level     Bathroom Shower/Tub: Occupational psychologist: Standard     Home Equipment: None           Prior Functioning/Environment Prior Level of Function : Independent/Modified Independent                        OT Problem List: Decreased strength;Decreased range of motion;Decreased coordination;Impaired UE functional use      OT Treatment/Interventions: Self-care/ADL training;Therapeutic exercise;Neuromuscular education;Therapeutic activities;Patient/family education    OT Goals(Current goals can be found in the care plan section) Acute Rehab OT Goals Patient Stated Goal: To get her RUE working OT Goal Formulation: All assessment and education complete, DC therapy Time For Goal Achievement: 04/28/21 Potential to Achieve Goals: Good  OT Frequency: Min 2X/week    Co-evaluation              AM-PAC OT "6 Clicks" Daily Activity     Outcome Measure Help from another person eating meals?: None Help from another person taking care of personal grooming?: None Help from another person toileting, which includes using toliet, bedpan, or urinal?: None Help from another person bathing (including washing, rinsing, drying)?: None Help from another person to put on and taking off regular upper body clothing?: None Help from another person to put on and taking off regular lower body clothing?: None 6 Click Score: 24   End of Session Nurse Communication: Mobility status  Activity Tolerance: Patient tolerated treatment well Patient left: in bed;with call bell/phone within reach  OT Visit Diagnosis: Other symptoms and signs involving the nervous system (R29.898);Hemiplegia and hemiparesis Hemiplegia - Right/Left: Right Hemiplegia - dominant/non-dominant: Dominant Hemiplegia - caused by: Other cerebrovascular disease (Brain mass)                Time: 2094-7096 OT Time Calculation (min): 23 min Charges:  OT General Charges $OT Visit: 1 Visit OT Evaluation $OT Eval Moderate Complexity: 1 Mod OT Treatments $Therapeutic Activity: 8-22 mins  Cynthia Gonzalez H., OTR/L Acute  Rehabilitation  Cynthia Gonzalez Cynthia Gonzalez 04/28/2021, 6:53 PM

## 2021-04-29 NOTE — Progress Notes (Signed)
°  Radiation Oncology         [phone] ________________________________  Name: QUYNN VILCHIS MRN: [id number]  Date: 05/02/2021  DOB: 1950/05/09  SIMULATION AND TREATMENT PLANNING NOTE    ICD-10-CM   1. Brain metastasis ([place])  C79.31       DIAGNOSIS:  71 y.o. 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  Identity was confirmed.  All relevant records and images related to the planned course of therapy were reviewed.  The patient freely provided informed written consent to proceed with treatment after reviewing the details related to the planned course of therapy. The consent form was witnessed and verified by the simulation staff. Intravenous access was established for contrast administration. Then, the patient was set-up in a stable reproducible supine position for radiation therapy.  A relocatable thermoplastic stereotactic head frame was fabricated for precise immobilization.  CT images were obtained.  Surface markings were placed.  The CT images were loaded into the planning software and fused with the patient's targeting MRI scan.  Then the target and avoidance structures were contoured.  Treatment planning then occurred.  The radiation prescription was entered and confirmed.  I have requested 3D planning  I have requested a DVH of the following structures: Brain stem, brain, left eye, right eye, lenses, optic chiasm, target volumes, uninvolved brain, and normal tissue.    SPECIAL TREATMENT PROCEDURE:  The planned course of therapy using radiation constitutes a special treatment procedure. Special care is required in the management of this patient for the following reasons. This treatment constitutes a Special Treatment Procedure for the following reason: High dose per fraction requiring special monitoring for increased toxicities of treatment including daily imaging.  The special nature of the planned  course of radiotherapy will require increased physician supervision and oversight to ensure patient's safety with optimal treatment outcomes.  This requires extended time and effort.  PLAN:  The patient will receive 20 Gy in 1 fraction.  ________________________________  Sheral Apley Tammi Klippel, M.D.

## 2021-05-01 ENCOUNTER — Encounter (HOSPITAL_COMMUNITY): Payer: Self-pay | Admitting: Pulmonary Disease

## 2021-05-01 LAB — CYTOLOGY - NON PAP

## 2021-05-02 ENCOUNTER — Ambulatory Visit
Admission: RE | Admit: 2021-05-02 | Discharge: 2021-05-02 | Disposition: A | Discharge status: Home / Self Care | Payer: Medicare Other | Source: Ambulatory Visit | Attending: Radiation Oncology | Admitting: Radiation Oncology

## 2021-05-02 ENCOUNTER — Other Ambulatory Visit: Payer: Self-pay

## 2021-05-02 VITALS — BP 150/68 | HR 73 | Temp 97.6°F | Resp 20 | Ht 65.0 in | Wt 121.4 lb

## 2021-05-02 DIAGNOSIS — Z510 Encounter for antineoplastic radiation therapy: Secondary | ICD-10-CM | POA: Diagnosis present

## 2021-05-02 DIAGNOSIS — C7931 Secondary malignant neoplasm of brain: Secondary | ICD-10-CM | POA: Insufficient documentation

## 2021-05-02 LAB — CYTOLOGY - NON PAP

## 2021-05-02 MED ORDER — SODIUM CHLORIDE 0.9% FLUSH
10.0000 mL | Freq: Once | INTRAVENOUS | Status: AC
  Administered 2021-05-02: 10 mL via INTRAVENOUS

## 2021-05-02 NOTE — Progress Notes (Signed)
Has armband been applied?  Yes.    Does patient have an allergy to IV contrast dye?: No.   Has patient ever received premedication for IV contrast dye?: No.   Does patient take metformin?: No.  If patient does take metformin when was the last dose: N/A  Date of lab work: April 27, 2021 BUN: 9 CR: 0.68  IV site: antecubital right, condition patent and no redness  Has IV site been added to flowsheet?  Yes.    BP (!) 150/68 (BP Location: Right Arm, Patient Position: Sitting, Cuff Size: Normal)    Pulse 73    Temp 97.6 F (36.4 C)    Resp 20    Ht 5\' 5"  (1.651 m)    Wt 121 lb 6.4 oz (55.1 kg)    SpO2 97%    BMI 20.20 kg/m

## 2021-05-04 ENCOUNTER — Other Ambulatory Visit: Payer: Self-pay

## 2021-05-04 DIAGNOSIS — R87610 Atypical squamous cells of undetermined significance on cytologic smear of cervix (ASC-US): Secondary | ICD-10-CM

## 2021-05-04 DIAGNOSIS — Z510 Encounter for antineoplastic radiation therapy: Secondary | ICD-10-CM | POA: Diagnosis not present

## 2021-05-04 DIAGNOSIS — C7931 Secondary malignant neoplasm of brain: Secondary | ICD-10-CM

## 2021-05-05 ENCOUNTER — Inpatient Hospital Stay (HOSPITAL_BASED_OUTPATIENT_CLINIC_OR_DEPARTMENT_OTHER): Payer: Medicare Other | Admitting: Hematology

## 2021-05-05 ENCOUNTER — Inpatient Hospital Stay: Payer: Medicare Other | Attending: Hematology

## 2021-05-05 ENCOUNTER — Encounter: Payer: Self-pay | Admitting: *Deleted

## 2021-05-05 ENCOUNTER — Other Ambulatory Visit: Payer: Self-pay

## 2021-05-05 VITALS — BP 145/71 | HR 74 | Temp 97.7°F | Resp 17 | Wt 121.0 lb

## 2021-05-05 DIAGNOSIS — Z79899 Other long term (current) drug therapy: Secondary | ICD-10-CM | POA: Insufficient documentation

## 2021-05-05 DIAGNOSIS — C779 Secondary and unspecified malignant neoplasm of lymph node, unspecified: Secondary | ICD-10-CM | POA: Diagnosis not present

## 2021-05-05 DIAGNOSIS — C7931 Secondary malignant neoplasm of brain: Secondary | ICD-10-CM | POA: Insufficient documentation

## 2021-05-05 DIAGNOSIS — R634 Abnormal weight loss: Secondary | ICD-10-CM | POA: Insufficient documentation

## 2021-05-05 DIAGNOSIS — R5383 Other fatigue: Secondary | ICD-10-CM | POA: Insufficient documentation

## 2021-05-05 DIAGNOSIS — C3412 Malignant neoplasm of upper lobe, left bronchus or lung: Secondary | ICD-10-CM | POA: Insufficient documentation

## 2021-05-05 DIAGNOSIS — I700 Atherosclerosis of aorta: Secondary | ICD-10-CM | POA: Insufficient documentation

## 2021-05-05 DIAGNOSIS — R87610 Atypical squamous cells of undetermined significance on cytologic smear of cervix (ASC-US): Secondary | ICD-10-CM

## 2021-05-05 DIAGNOSIS — C3490 Malignant neoplasm of unspecified part of unspecified bronchus or lung: Secondary | ICD-10-CM

## 2021-05-05 DIAGNOSIS — R531 Weakness: Secondary | ICD-10-CM | POA: Insufficient documentation

## 2021-05-05 DIAGNOSIS — C3492 Malignant neoplasm of unspecified part of left bronchus or lung: Secondary | ICD-10-CM

## 2021-05-05 LAB — CBC WITH DIFFERENTIAL (CANCER CENTER ONLY)
Abs Immature Granulocytes: 0.35 10*3/uL — ABNORMAL HIGH (ref 0.00–0.07)
Basophils Absolute: 0 10*3/uL (ref 0.0–0.1)
Basophils Relative: 0 %
Eosinophils Absolute: 0 10*3/uL (ref 0.0–0.5)
Eosinophils Relative: 0 %
HCT: 43.2 % (ref 36.0–46.0)
Hemoglobin: 14.6 g/dL (ref 12.0–15.0)
Immature Granulocytes: 2 %
Lymphocytes Relative: 5 %
Lymphs Abs: 0.9 10*3/uL (ref 0.7–4.0)
MCH: 29 pg (ref 26.0–34.0)
MCHC: 33.8 g/dL (ref 30.0–36.0)
MCV: 85.9 fL (ref 80.0–100.0)
Monocytes Absolute: 0.8 10*3/uL (ref 0.1–1.0)
Monocytes Relative: 4 %
Neutro Abs: 17.5 10*3/uL — ABNORMAL HIGH (ref 1.7–7.7)
Neutrophils Relative %: 89 %
Platelet Count: 398 10*3/uL (ref 150–400)
RBC: 5.03 MIL/uL (ref 3.87–5.11)
RDW: 13.1 % (ref 11.5–15.5)
WBC Count: 19.5 10*3/uL — ABNORMAL HIGH (ref 4.0–10.5)
nRBC: 0 % (ref 0.0–0.2)

## 2021-05-05 LAB — CMP (CANCER CENTER ONLY)
ALT: 16 U/L (ref 0–44)
AST: 13 U/L — ABNORMAL LOW (ref 15–41)
Albumin: 3.6 g/dL (ref 3.5–5.0)
Alkaline Phosphatase: 80 U/L (ref 38–126)
Anion gap: 11 (ref 5–15)
BUN: 19 mg/dL (ref 8–23)
CO2: 23 mmol/L (ref 22–32)
Calcium: 8.3 mg/dL — ABNORMAL LOW (ref 8.9–10.3)
Chloride: 102 mmol/L (ref 98–111)
Creatinine: 0.8 mg/dL (ref 0.44–1.00)
GFR, Estimated: >60 mL/min (ref >60)
Glucose, Bld: 125 mg/dL — ABNORMAL HIGH (ref 70–99)
Potassium: 4 mmol/L (ref 3.5–5.1)
Sodium: 136 mmol/L (ref 135–145)
Total Bilirubin: 0.3 mg/dL (ref 0.3–1.2)
Total Protein: 6.3 g/dL — ABNORMAL LOW (ref 6.5–8.1)

## 2021-05-05 MED ORDER — ONDANSETRON HCL 8 MG PO TABS: 8.0000 mg | ORAL_TABLET | Freq: Two times a day (BID) | ORAL | 1 refills | Status: DC | PRN

## 2021-05-05 MED ORDER — PROCHLORPERAZINE MALEATE 10 MG PO TABS: 10.0000 mg | ORAL_TABLET | Freq: Four times a day (QID) | ORAL | 1 refills | Status: DC | PRN

## 2021-05-05 MED ORDER — LIDOCAINE-PRILOCAINE 2.5-2.5 % EX CREA: TOPICAL_CREAM | CUTANEOUS | 3 refills | Status: DC

## 2021-05-05 NOTE — Progress Notes (Signed)
START ON PATHWAY REGIMEN - Non-Small Cell Lung     A cycle is every 21 days:     Pembrolizumab      Paclitaxel      Carboplatin   **Always confirm dose/schedule in your pharmacy ordering system**  Patient Characteristics: Stage IV Metastatic, Squamous, Molecular Analysis Not Elected, PS = 0, 1, Initial Chemotherapy/Immunotherapy, Immunotherapy Candidate, PD-L1 Expression Positive 1-49% (TPS) / Negative / Not Tested / Awaiting Test Results Therapeutic Status: Stage IV Metastatic Histology: Squamous Cell ECOG Performance Status: 1 Chemotherapy/Immunotherapy Line of Therapy: Initial Chemotherapy/Immunotherapy Immunotherapy Candidate Status: Candidate for Immunotherapy PD-L1 Expression Status: Awaiting Test Results Intent of Therapy: Non-Curative / Palliative Intent, Discussed with Patient 

## 2021-05-05 NOTE — Progress Notes (Addendum)
[place]   [phone] [phone]   Clinic Follow up Note   Patient Care Team: Nolene Ebbs, MD as PCP - General (Internal Medicine)  Date of Service:  05/05/2021  CHIEF COMPLAINT: f/u of metastatic lung cancer  CURRENT THERAPY:  Pending brain radiation  Pending systemic chemotherapy  ASSESSMENT & PLAN:  Cynthia Gonzalez is a 71 y.o. female with moderate to heavy smoking history   1. Squamous Cell LUL Lung Cancer, with nodal and brain metastasis -presented to ED 04/26/21 with right hand and wrist swelling and weakness. She was also found to have left-sided facial dropping on physical exam. Head CT revealed a 1.7 cm left frontal lobe mass and a right parapharyngeal space mass. CT CAP showed a LUL mass, right hilar mass, mediastinal and hilar adenopathy, as well as nonspecific left adrenal and gastric cardia wall thickening. I reviewed with her and her daughter again today  -inpatient bronchoscopy 04/28/21 by Dr. Valeta Harms and biopsy of LUL lung mass and 11R node both confirmed non-small cell carcinoma, IHC consistent with squamous cell histology. -seen by Dr. Tammi Klippel in the hospital, scheduled for [facility] treatment on 05/08/21. -I reviewed the findings and work up thus far with the patient today. -I recommend obtaining PET scan to further evaluate and characterize the spread of her cancer, especially the right neck and left adrenal lesion. -I reviewed with him curable nature of her metastatic cancer, and recommend systemic treatment after brain radiation.  Given the squamous cell carcinoma, I recommend first-line chemotherapy carboplatin and paclitaxel with Keytruda every 3 weeks. Will obtain PD-L1 expression on her biopsy  --Chemotherapy consent: Side effects including but does not not limited to, fatigue, nausea, vomiting, diarrhea, hair loss, neuropathy, fluid retention, renal and kidney dysfunction, neutropenic fever, needed for blood transfusion, bleeding,  were discussed with patient in great detail. She agrees to proceed. -The goal of chemotherapy is palliative, for disease control and prolong her life.  2. Oligo brian metastasis  -plan to have SBRT next Monday  -Dexamethasone tapering per Dr. Tammi Klippel -We will monitor her brain MRI every 3 months    PLAN: -I reviewed her biopsy results, and the skin findings -She will proceed with brain SRS radiation on Monday 1/16 -will request PD-L1 on biopsy sample  -plan to start first-line chemotherapy carboplatin, paclitaxel and Keytruda in 2 weeks -chemo class, IR port placement, PET scan in next two weeks  -pt met our navigator Dana today    No problem-specific Assessment & Plan notes found for this encounter.   SUMMARY OF ONCOLOGIC HISTORY: Oncology History  Brain metastasis ([place])  04/26/2021 Imaging   EXAM: CT HEAD WITHOUT CONTRAST  IMPRESSION: 1. Approximally 1.7 cm left frontal lobe mass with moderate surrounding vasogenic edema and minimal mass effect. May represent metastatic or primary lesion. MRI brain with contrast recommended as well as oncologic workup as indicated. 2. Partially visualized hypodense mass in the right neck parapharyngeal space. Indeterminate but could also represent metastasis given the brain findings. Could also be evaluated further with CT neck with contrast.   04/27/2021 Imaging   EXAM: MRI HEAD WITHOUT AND WITH CONTRAST  IMPRESSION: 1. 1.9 x 1.5 x 1.9 cm intraparenchymal mass involving the posterior left frontoparietal region with surrounding vasogenic edema. 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 2. No other acute intracranial process. 3. 2.1 x 2.3 cm heterogeneous mass within the right parapharyngeal space, better evaluated on prior neck CT.  04/27/2021 Initial Diagnosis   Brain metastasis ([facility])   Squamous cell lung cancer, left ([place])  04/26/2021 Imaging   EXAM: CT NECK WITH  CONTRAST  IMPRESSION: 1. Heterogeneously enhancing, centrally hypodense mass in the right parapharyngeal space, favored to represent an enlarged, necrotic right level 2A lymph node, concerning for metastatic disease. Biopsy is recommended. 2. No other acute finding in the neck.   04/26/2021 Imaging   EXAM: CT CHEST, ABDOMEN, AND PELVIS WITH CONTRAST  IMPRESSION: 1. Large spiculated left upper lobe mass consistent with primary lung cancer. 2. Right hilar mass versus adenopathy, with partial obstruction of segmental right upper lobe bronchi. 3. Mediastinal and hilar adenopathy consistent with nodal metastases. 4. Nonspecific left adrenal thickening, metastatic disease not excluded. 5. Nonspecific wall thickening of the gastric cardia. 6. 3.7 cm infrarenal abdominal aortic aneurysm. Recommend follow-up every 2 years. Reference: J Am Coll Radiol 5597;41:638-453. 7. 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 The bilateral profundus femoral arteries remain patent. 8. Cholelithiasis without cholecystitis. 9.  Aortic Atherosclerosis (ICD10-I70.0).   04/28/2021 Pathology Results   FINAL MICROSCOPIC DIAGNOSIS:  A. LUNG, LUL, BRUSHING:  - Malignant cells consistent with non-small cell carcinoma   B. LUNG, LUL, FINE NEEDLE ASPIRATION:  - Malignant cells consistent with non-small cell carcinoma   C. LYMPH NODE, 11R, FINE NEEDLE ASPIRATION:  - Malignant cells consistent with non-small cell carcinoma   ADDENDUM:  The malignant cells are positive with cytokeratin 5/6 and p63 and negative with TTF-1 consistent with squamous cell carcinoma.    05/05/2021 Initial Diagnosis   Squamous cell lung cancer, left (HCC)      INTERVAL HISTORY:  Cynthia Gonzalez is here for a follow up of metastatic lung cancer. She was last seen by me on 04/27/21 while she was in the hospital. She presents to the clinic with her daughter today.  Her right-sided weakness has improved, she  still has some difficulty with her right hand function, but overall much improved.  She denies any other new symptoms.  Review of systems positive for weight loss, mild fatigue, but she is able to function well.  No nausea, cough, dyspnea, or other symptoms.   All other systems were reviewed with the patient and are negative.  MEDICAL HISTORY:  Past Medical History:  Diagnosis Date   Allergy    Blood transfusion without reported diagnosis 1976   Hyperlipidemia     SURGICAL HISTORY: Past Surgical History:  Procedure Laterality Date   BRONCHIAL BIOPSY  04/28/2021   Procedure: BRONCHIAL BIOPSIES;  Surgeon: Garner Nash, DO;  Location: [facility];  Service: Pulmonary;;   BRONCHIAL BRUSHINGS  04/28/2021   Procedure: BRONCHIAL BRUSHINGS;  Surgeon: Garner Nash, DO;  Location: [facility];  Service: Pulmonary;;   BRONCHIAL NEEDLE ASPIRATION BIOPSY  04/28/2021   Procedure: BRONCHIAL NEEDLE ASPIRATION BIOPSIES;  Surgeon: Garner Nash, DO;  Location: [facility] [facility];  Service: Pulmonary;;   DENTAL SURGERY     with sedative per pt, not sedated   [place]     x7-sedation with 1 delivery [place] N/A 04/28/2021   Procedure: VIDEO BRONCHOSCOPY WITH ENDOBRONCHIAL ULTRASOUND;  Surgeon: Garner Nash, DO;  Location: [facility];  Service: Pulmonary;  Laterality: N/A;   VIDEO BRONCHOSCOPY WITH RADIAL ENDOBRONCHIAL ULTRASOUND  04/28/2021   Procedure: VIDEO BRONCHOSCOPY WITH RADIAL ENDOBRONCHIAL ULTRASOUND;  Surgeon: Garner Nash, DO;  Location: [facility];  Service: Pulmonary;;  I have reviewed the social history and family history with the patient and they are unchanged from previous note.  ALLERGIES:  has No Known Allergies.  MEDICATIONS:  Current Outpatient Medications  Medication Sig Dispense Refill   atorvastatin (LIPITOR) 40 MG tablet Take 1 tablet (40 mg total) by mouth daily. 30 tablet 2    dexamethasone (DECADRON) 4 MG tablet Take 1 tablet (4 mg total) by mouth 4 (four) times daily. 120 tablet 0   guaiFENesin-dextromethorphan (ROBITUSSIN DM) 100-10 MG/5ML syrup Take 10 mLs by mouth every 4 (four) hours as needed for cough. 118 mL 0   pantoprazole (PROTONIX) 40 MG tablet Take 1 tablet (40 mg total) by mouth daily. 30 tablet 0   No current facility-administered medications for this visit.    PHYSICAL EXAMINATION: ECOG PERFORMANCE STATUS: 1 - Symptomatic but completely ambulatory  Vitals:   05/05/21 1259  BP: (!) 145/71  Pulse: 74  Resp: 17  Temp: 97.7 F (36.5 C)  SpO2: 96%   Wt Readings from Last 3 Encounters:  05/05/21 121 lb (54.9 kg)  05/02/21 121 lb 6.4 oz (55.1 kg)  04/26/21 118 lb (53.5 kg)     GENERAL:alert, no distress and comfortable SKIN: skin color, texture, turgor are normal, no rashes or significant lesions EYES: normal, Conjunctiva are pink and non-injected, sclera clear NECK: supple, thyroid normal size, non-tender, without nodularity LYMPH:  no palpable lymphadenopathy in the cervical, axillary  LUNGS: clear to auscultation and percussion with normal breathing effort HEART: regular rate & rhythm and no murmurs and no lower extremity edema ABDOMEN:abdomen soft, non-tender and normal bowel sounds Musculoskeletal:no cyanosis of digits and no clubbing  NEURO: alert & oriented x 3 with fluent speech, (+) reduced grabbing strength of right hand, no other focal motor/sensory deficits  LABORATORY DATA:  I have reviewed the data as listed CBC Latest Ref Rng & Units 05/05/2021 04/28/2021 04/27/2021  WBC 4.0 - 10.5 K/uL 19.5(H) 18.2(H) 3.3(L)  Hemoglobin 12.0 - 15.0 g/dL 14.6 12.3 13.1  Hematocrit 36.0 - 46.0 % 43.2 37.0 38.7  Platelets 150 - 400 K/uL 398 446(H) 418(H)     CMP Latest Ref Rng & Units 05/05/2021 04/28/2021 04/27/2021  Glucose 70 - 99 mg/dL 125(H) - 171(H)  BUN 8 - 23 mg/dL 19 - 9  Creatinine 0.44 - 1.00 mg/dL 0.80 - 0.68  Sodium 135 - 145  mmol/L 136 - 135  Potassium 3.5 - 5.1 mmol/L 4.0 - 4.2  Chloride 98 - 111 mmol/L 102 - 105  CO2 22 - 32 mmol/L 23 - 21(L)  Calcium 8.9 - 10.3 mg/dL 8.3(L) - 9.5  Total Protein 6.5 - 8.1 g/dL 6.3(L) 6.4(L) 6.7  Total Bilirubin 0.3 - 1.2 mg/dL 0.3 0.2(L) 0.4  Alkaline Phos 38 - 126 U/L 80 91 105  AST 15 - 41 U/L 13(L) 15 12(L)  ALT 0 - 44 U/L _0 RADIOGRAPHIC STUDIES: I have personally reviewed the radiological images as listed and agreed with the findings in the report. No results found.    Orders Placed This Encounter  Procedures   NM PET Image Initial (PI) Skull Base To Thigh    Standing Status:   Future    Standing Expiration Date:   05/05/2022    Order Specific Question:   If indicated for the ordered procedure, I authorize the administration of a radiopharmaceutical per Radiology protocol    Answer:   Yes    Order Specific Question:   Preferred imaging  location?    Answer:   [place]   IR IMAGING GUIDED PORT INSERTION    Standing Status:   Future    Standing Expiration Date:   05/06/2022    Order Specific Question:   Reason for Exam (SYMPTOM  OR DIAGNOSIS REQUIRED)    Answer:   chemo    Order Specific Question:   Preferred Imaging Location?    Answer:   [facility]   CBC with Differential (Cancer Center Only)    Standing Status:   Standing    Number of Occurrences:   20    Standing Expiration Date:   05/05/2022   CMP ([place] only)    Standing Status:   Standing    Number of Occurrences:   20    Standing Expiration Date:   05/05/2022   T4    Standing Status:   Standing    Number of Occurrences:   20    Standing Expiration Date:   05/05/2022   TSH    Standing Status:   Standing    Number of Occurrences:   20    Standing Expiration Date:   05/05/2022   All questions were answered. The patient knows to call the clinic with any problems, questions or concerns. No barriers to learning was detected. The total time spent in the appointment was 40  minutes.     Truitt Merle, MD 05/05/2021   I, Wilburn Mylar, am acting as scribe for Truitt Merle, MD.   I have reviewed the above documentation for accuracy and completeness, and I agree with the above.

## 2021-05-05 NOTE — Progress Notes (Signed)
Oncology Nurse Navigator Documentation  Oncology Nurse Navigator Flowsheets 05/05/2021  Abnormal Finding Date 04/26/2021  Confirmed Diagnosis Date 04/28/2021  Planned Course of Treatment Chemotherapy  Phase of Treatment Chemo  Navigator Follow Up Date: 05/08/2021  Navigator Follow Up Reason: Appointment Review  Navigator Location [facility]  Referral Date to RadOnc/MedOnc 05/05/2021  Navigator Encounter Type Clinic/MDC;Initial MedOnc  Patient Visit Type Initial;MedOnc  Treatment Phase Pre-Tx/Tx Discussion  Barriers/Navigation Needs Coordination of Care;Education/I spoke to Ms. Offenberger and her daughter today at her first visit to see med onc. I gave and help to explain dx and plan of care.  I will contact insurance auth coordinator to help get scans approved and scheduled.   Education Newly Diagnosed Cancer Education;Understanding Cancer/ Treatment Options;Other  Interventions Coordination of Care;Education;Psycho-Social Support  Acuity Level 3-Moderate Needs (3-4 Barriers Identified)  Coordination of Care Other  Education Method Verbal;Written  Support Groups/Services Other  Time Spent with Patient 23

## 2021-05-06 ENCOUNTER — Encounter: Payer: Self-pay | Admitting: Hematology

## 2021-05-07 NOTE — Progress Notes (Signed)
°  Radiation Oncology         [phone] ________________________________  Stereotactic Treatment Procedure Note  Name: Cynthia Gonzalez MRN: [id number]  Date: 05/08/2021  DOB: 03-18-1951  SPECIAL TREATMENT PROCEDURE    ICD-10-CM   1. Brain metastasis ([place])  C79.31       3D TREATMENT PLANNING AND DOSIMETRY:  The patient's radiation plan was reviewed and approved by neurosurgery and radiation oncology prior to treatment.  It showed 3-dimensional radiation distributions overlaid onto the planning CT/MRI image set.  [facility] for the target structures as well as the organs at risk were reviewed. The documentation of the 3D plan and dosimetry are filed in the radiation oncology EMR.  NARRATIVE:  Cynthia Gonzalez was brought to the TrueBeam stereotactic radiation treatment machine and placed supine on the CT couch. The head frame was applied, and the patient was set up for stereotactic radiosurgery.  Neurosurgery was present for the set-up and delivery  SIMULATION VERIFICATION:  In the couch zero-angle position, the patient underwent Exactrac imaging using the Brainlab system with orthogonal KV images.  These were carefully aligned and repeated to confirm treatment position for each of the isocenters.  The Exactrac snap film verification was repeated at each couch angle.  PROCEDURE: Cynthia Gonzalez received stereotactic radiosurgery to the following targets: Left Frontal 19 mm target was treated using 6 Rapid Arc VMAT Beams to a prescription dose of 20 Gy.  ExacTrac registration was performed for each couch angle.  The 100% isodose line was prescribed.  6 MV X-rays were delivered in the flattening filter free beam mode.  STEREOTACTIC TREATMENT MANAGEMENT:  Following delivery, the patient was transported to nursing in stable condition and monitored for possible acute effects.  Vital signs were recorded . The patient tolerated treatment without significant acute effects, and was discharged to  home in stable condition.    PLAN: Follow-up in one month.  She was given a 28-day steroid taper.  I would prefer a faster taper, due to immunotherapy.  However, due to the location, size and edema with her metastasis, I suspect she will not tolerate a faster taper.  ________________________________  Sheral Apley Tammi Klippel, M.D.

## 2021-05-08 ENCOUNTER — Ambulatory Visit
Admission: RE | Admit: 2021-05-08 | Discharge: 2021-05-08 | Disposition: A | Discharge status: Home / Self Care | Payer: Medicare Other | Source: Ambulatory Visit | Attending: Radiation Oncology | Admitting: Radiation Oncology

## 2021-05-08 ENCOUNTER — Other Ambulatory Visit: Payer: Self-pay | Admitting: Radiation Oncology

## 2021-05-08 ENCOUNTER — Encounter: Payer: Self-pay | Admitting: *Deleted

## 2021-05-08 ENCOUNTER — Other Ambulatory Visit: Payer: Self-pay

## 2021-05-08 ENCOUNTER — Encounter: Payer: Self-pay | Admitting: Radiation Oncology

## 2021-05-08 DIAGNOSIS — C7931 Secondary malignant neoplasm of brain: Secondary | ICD-10-CM

## 2021-05-08 DIAGNOSIS — Z510 Encounter for antineoplastic radiation therapy: Secondary | ICD-10-CM | POA: Diagnosis not present

## 2021-05-08 MED ORDER — DEXAMETHASONE 4 MG PO TABS: ORAL_TABLET | ORAL | 0 refills | Status: AC

## 2021-05-08 MED ORDER — DEXAMETHASONE 4 MG PO TABS: ORAL_TABLET | ORAL | 0 refills | Status: DC

## 2021-05-08 NOTE — Progress Notes (Signed)
Patient in for [facility] Brain treatment was in nursing for observation 30 minutes.  Vitals:  temp=97.2, pulse=81, respirations=18, BP= 143/76 (left arm), and pulse ox= 97%.  Alert and verbal denies headache, nausea, and blurred vision at this time no pain.  Gait stable.  Dr. Tammi Klippel in to go over Decadron tapering instructions.  Nothing else follows.

## 2021-05-08 NOTE — Progress Notes (Signed)
Oncology Nurse Navigator Documentation  Oncology Nurse Navigator Flowsheets 05/08/2021 05/05/2021  Abnormal Finding Date - 04/26/2021  Confirmed Diagnosis Date - 04/28/2021  Planned Course of Treatment Radiation Chemotherapy  Phase of Treatment Radiation Chemo  Radiation Actual Start Date: 05/08/2021 -  Navigator Follow Up Date: 05/11/2021 05/08/2021  Navigator Follow Up Reason: Appointment Review Appointment Review  Navigator Location [facility]  Referral Date to RadOnc/MedOnc - 05/05/2021  Navigator Encounter Type Clinic/MDC;Initial RadOnc Clinic/MDC;Initial MedOnc  Patient Visit Type RadOnc Initial;MedOnc  Treatment Phase Treatment Pre-Tx/Tx Discussion  Barriers/Navigation Needs Coordination of Care;Education/I followed up on appts for Cynthia Gonzalez today. She was not scheduled for port or pet scan. I called radiology central scheduling and was given an appt time and date. I then called [facility] to set up port. I was given an appt time and date. [facility] getting [facility]. I went to speak to her daughter. I updated her on appts and pre-procedure instructions.  Daughter verbalized understanding of appts and saw it on mychart.   Coordination of Care;Education  Education Other Newly Diagnosed Cancer Education;Understanding Cancer/ Treatment Options;Other  Interventions Coordination of Care;Psycho-Social Support;Education Coordination of Care;Education;Psycho-Social Support  Acuity Level 3-Moderate Needs (3-4 Barriers Identified) Level 3-Moderate Needs (3-4 Barriers Identified)  Coordination of Care Appts Other  Education Method Verbal;Written Verbal;Written  Support Groups/Services - Other  Time Spent with Patient 86 57

## 2021-05-09 ENCOUNTER — Telehealth: Payer: Self-pay | Admitting: Radiation Therapy

## 2021-05-09 ENCOUNTER — Telehealth: Payer: Self-pay | Admitting: Hematology

## 2021-05-09 NOTE — Telephone Encounter (Signed)
Doing well after the completion of SRS treatment. No fatigue, headache, or other symptoms. Cynthia Gonzalez was given steroid taper instructions after the treatment yesterday by Dr. Tammi Klippel. Patient and daughter understand to call and let us know if she develops any new or worsening symptoms as she tapers down off the steroids.   The patient is complaining of soreness in her mouth with patchiness concerning for thrush. I will forward this to Dr. Tammi Klippel and [facility] for an Rx .  Preferred Pharmacy - [facility] [facility] [address] [place] R.T.(R)(T) Radiation Special Procedures Navigator

## 2021-05-09 NOTE — Telephone Encounter (Signed)
Scheduled follow-up appointments per 1/13 los. Patient's daughter is aware.

## 2021-05-10 ENCOUNTER — Encounter: Payer: Self-pay | Admitting: *Deleted

## 2021-05-10 NOTE — Progress Notes (Signed)
Oncology Nurse Navigator Documentation  Oncology Nurse Navigator Flowsheets 05/10/2021 05/08/2021 05/05/2021  Abnormal Finding Date - - 04/26/2021  Confirmed Diagnosis Date - - 04/28/2021  Planned Course of Treatment - Radiation Chemotherapy  Phase of Treatment - Radiation Chemo  Radiation Actual Start Date: - 05/08/2021 -  Navigator Follow Up Date: 05/19/2021 05/11/2021 05/08/2021  Navigator Follow Up Reason: Scan Review Appointment Review Appointment Review  Navigator Location [facility]  Referral Date to RadOnc/MedOnc - - 05/05/2021  Navigator Encounter Type Other: Clinic/MDC;Initial RadOnc Clinic/MDC;Initial MedOnc  Patient Visit Type - RadOnc Initial;MedOnc  Treatment Phase - Treatment Pre-Tx/Tx Discussion  Barriers/Navigation Needs Coordination of Care Coordination of Care;Education Coordination of Care;Education  Education - Other Newly Diagnosed Cancer Education;Understanding Cancer/ Treatment Options;Other  Interventions Coordination of Care/I followed up on insurance auth for PET scan. This is not completed so I reached out to [place] team for an update. Wait for response.  Coordination of Care;Psycho-Social Support;Education Coordination of Care;Education;Psycho-Social Support  Acuity Level 2-Minimal Needs (1-2 Barriers Identified) Level 3-Moderate Needs (3-4 Barriers Identified) Level 3-Moderate Needs (3-4 Barriers Identified)  Coordination of Care Other Appts Other  Education Method - Verbal;Written Verbal;Written  Support Groups/Services - - Other  Time Spent with Patient [phone]

## 2021-05-11 ENCOUNTER — Inpatient Hospital Stay: Payer: Medicare Other

## 2021-05-11 ENCOUNTER — Other Ambulatory Visit: Payer: Self-pay | Admitting: Radiation Oncology

## 2021-05-11 ENCOUNTER — Other Ambulatory Visit: Payer: Self-pay

## 2021-05-11 MED ORDER — NYSTATIN 100000 UNIT/ML MT SUSP: 5.0000 mL | Freq: Four times a day (QID) | OROMUCOSAL | 0 refills | Status: AC

## 2021-05-17 NOTE — Progress Notes (Signed)
Pharmacist Chemotherapy Monitoring - Initial Assessment    Anticipated start date: 05/17/21   The following has been reviewed per standard work regarding the patient's treatment regimen: The patient's diagnosis, treatment plan and drug doses, and organ/hematologic function Lab orders and baseline tests specific to treatment regimen  The treatment plan start date, drug sequencing, and pre-medications Prior authorization status  Patient's documented medication list, including drug-drug interaction screen and prescriptions for anti-emetics and supportive care specific to the treatment regimen The drug concentrations, fluid compatibility, administration routes, and timing of the medications to be used The patient's access for treatment and lifetime cumulative dose history, if applicable  The patient's medication allergies and previous infusion related reactions, if applicable   Changes made to treatment plan:  N/A  Follow up needed:  N/A   [name], RPH, 05/17/2021  1:44 PM

## 2021-05-18 ENCOUNTER — Ambulatory Visit (HOSPITAL_COMMUNITY)
Admission: RE | Admit: 2021-05-18 | Discharge: 2021-05-18 | Disposition: A | Discharge status: Home / Self Care | Payer: Medicare Other | Source: Ambulatory Visit | Attending: Hematology | Admitting: Hematology

## 2021-05-18 ENCOUNTER — Encounter: Payer: Self-pay | Admitting: Hematology

## 2021-05-18 ENCOUNTER — Other Ambulatory Visit: Payer: Self-pay

## 2021-05-18 DIAGNOSIS — C3490 Malignant neoplasm of unspecified part of unspecified bronchus or lung: Secondary | ICD-10-CM | POA: Diagnosis not present

## 2021-05-18 LAB — GLUCOSE, CAPILLARY: Glucose-Capillary: 98 mg/dL (ref 70–99)

## 2021-05-18 IMAGING — CT NM PET TUM IMG INITIAL (PI) SKULL BASE T - THIGH
1 of 7 series · 1 of 25 positions shown · non-contrast
Comparison: None.

CLINICAL DATA: initial treatment strategy for non-small cell lung
cancer.

EXAM:
NUCLEAR MEDICINE PET SKULL BASE TO THIGH
TECHNIQUE: 5.9 mCi F-18 FDG was injected intravenously. Full-ring PET imaging
was performed from the skull base to thigh after the radiotracer. CT
data was obtained and used for attenuation correction and anatomic
localization.
Fasting blood glucose: 98 mg/dl

[Series 3: pet sk_thigh ac · axial · 5.0mm · 4.07mm/px · 1 of 222 slices shown]
[im 133/222]
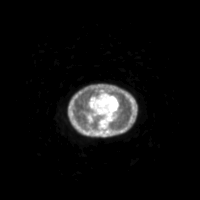

[1 of 25 positions shown; findings below may reference images not displayed]

FINDINGS: Mediastinal blood pool activity: SUV max

Liver activity: SUV max NA

NECK: No hypermetabolic lymph nodes in the neck.

Incidental CT findings: none

CHEST: Rounded hypermetabolic mass in the LEFT upper lobe measures
3.2 x 2.9 cm with intense metabolic activity SUV max equal 32.9.
Small gas cavitation within the mass. No additional hypermetabolic
nodules.

Hypermetabolic contralateral RIGHT lower paratracheal and hilar
hyper hypermetabolic nodes. RIGHT paratracheal node with SUV max
equal 8.5. RIGHT hilar node with SUV max 8.6. Smaller more superior
hypermetabolic nodes in the mediastinum.

Incidental CT findings: none

ABDOMEN/PELVIS: No abnormal hypermetabolic activity within the
liver, pancreas, adrenal glands, or spleen. No hypermetabolic lymph
nodes in the abdomen or pelvis.

Incidental CT findings: none

SKELETON: No focal hypermetabolic activity to suggest skeletal
metastasis.

Incidental CT findings: none
IMPRESSION: 1. Hypermetabolic rounded mass in the LEFT upper lobe most
consistent with bronchogenic carcinoma.
2. Contralateral hypermetabolic paratracheal lymph node and RIGHT
hilar lymph node metastasis.
3. No evidence of metastatic disease outside the thorax.

## 2021-05-18 MED ORDER — FLUDEOXYGLUCOSE F - 18 (FDG) INJECTION
6.0000 | Freq: Once | INTRAVENOUS | Status: AC | PRN
  Administered 2021-05-18: 5.95 via INTRAVENOUS

## 2021-05-19 ENCOUNTER — Ambulatory Visit (INDEPENDENT_AMBULATORY_CARE_PROVIDER_SITE_OTHER): Payer: Medicare Other | Admitting: Pulmonary Disease

## 2021-05-19 ENCOUNTER — Encounter: Payer: Self-pay | Admitting: Pulmonary Disease

## 2021-05-19 ENCOUNTER — Other Ambulatory Visit: Payer: Self-pay | Admitting: Radiology

## 2021-05-19 VITALS — BP 138/60 | HR 83 | Temp 98.1°F | Ht 65.0 in | Wt 115.2 lb

## 2021-05-19 DIAGNOSIS — C3492 Malignant neoplasm of unspecified part of left bronchus or lung: Secondary | ICD-10-CM | POA: Diagnosis not present

## 2021-05-19 DIAGNOSIS — Z9889 Other specified postprocedural states: Secondary | ICD-10-CM

## 2021-05-19 DIAGNOSIS — C7931 Secondary malignant neoplasm of brain: Secondary | ICD-10-CM

## 2021-05-19 NOTE — Patient Instructions (Signed)
Thank you for visiting Dr. Valeta Harms at [facility] Pul[facility]. Today we recommend the following:  Call us if your breathing symptoms change or worsen.   Return in about 6 months (around 11/16/2021) for with APP or Dr. Valeta Harms.    Please do your part to reduce the spread of COVID-19.

## 2021-05-19 NOTE — Procedures (Signed)
°  Name: Cynthia Gonzalez  MRN: [id number]  Date: 05/08/2021   DOB: November 19, 1950  Stereotactic Radiosurgery Operative Note  PRE-OPERATIVE DIAGNOSIS:  Solitary Brain Metastasis  POST-OPERATIVE DIAGNOSIS:  Solitary Brain Metastasis  PROCEDURE:  Stereotactic Radiosurgery  SURGEON:  Elaina Hoops, MD  NARRATIVE: The patient underwent a radiation treatment planning session in the radiation oncology simulation suite under the care of the radiation oncology physician and physicist.  I participated closely in the radiation treatment planning afterwards. The patient underwent planning CT which was fused to 3T high resolution MRI with 1 mm axial slices.  These images were fused on the planning system.  We contoured the gross target volumes and subsequently expanded this to yield the Planning Target Volume. I actively participated in the planning process.  I helped to define and review the target contours and also the contours of the optic pathway, eyes, brainstem and selected nearby organs at risk.  All the dose constraints for critical structures were reviewed and compared to AAPM Task Group 101.  The prescription dose conformity was reviewed.  I approved the plan electronically.    Accordingly, [name] was brought to the TrueBeam stereotactic radiation treatment linac and placed in the custom immobilization mask.  The patient was aligned according to the IR fiducial markers with BrainLab Exactrac, then orthogonal x-rays were used in ExacTrac with the 6DOF robotic table and the shifts were made to align the patient  [name] received stereotactic radiosurgery uneventfully.    The detailed description of the procedure is recorded in the radiation oncology procedure note.  I was present for the duration of the procedure.  DISPOSITION:  Following delivery, the patient was transported to nursing in stable condition and monitored for possible acute effects to be discharged to home in stable  condition with follow-up in one month.  Elaina Hoops, MD 05/19/2021 4:28 PM

## 2021-05-19 NOTE — Progress Notes (Signed)
Synopsis: Referred in Jen 2023 for lung cancer by Nolene Ebbs, MD  Subjective:   PATIENT ID: Cynthia Gonzalez GENDER: female DOB: 02-18-1951, MRN: [id number]  Chief Complaint  Patient presents with   Follow-up    Patient says everything is going good. She says she still has trouble getting up and down.     This is a 71 year old female, past medical history of hyperlipidemia, longstanding history of tobacco use.  Patient presented to the hospital with strokelike symptoms.  MRI of the brain found a 1.9 cm metastatic lesion to the brain.  Patient had CT scan of the chest which revealed lung mass with no significant adenopathy.  Patient was taken forRobotic assisted navigational bronchoscopy with tissue sampling.  Patient was diagnosed with stage IV non-small cell lung cancer which was positive for cytokeratin 5/6 and p63, negative TTF-1 consistent with squamous cell carcinoma of the lung.  Patient has already completed brain radiation with Dr. Tammi Klippel.  Has established care with medical oncology.  She has a port placement scheduled for next week to start chemotherapy.  From a respiratory standpoint patient is able to take care of herself.  She has 7 children.  She is able to complete all of her activities of daily living.  She does have some difficulty with standing at times that she is able to do this with taking slow she has dropped her Decadron dosing recently.   Past Medical History:  Diagnosis Date   Allergy    Blood transfusion without reported diagnosis 1976   Hyperlipidemia      Family History  Problem Relation Age of Onset   Cirrhosis Mother    Cancer Father    Colon cancer Neg Hx    Rectal cancer Neg Hx    Stomach cancer Neg Hx      Past Surgical History:  Procedure Laterality Date   BRONCHIAL BIOPSY  04/28/2021   Procedure: BRONCHIAL BIOPSIES;  Surgeon: Garner Nash, DO;  Location: [facility];  Service: Pulmonary;;   BRONCHIAL BRUSHINGS  04/28/2021   Procedure:  BRONCHIAL BRUSHINGS;  Surgeon: Garner Nash, DO;  Location: [facility];  Service: Pulmonary;;   BRONCHIAL NEEDLE ASPIRATION BIOPSY  04/28/2021   Procedure: BRONCHIAL NEEDLE ASPIRATION BIOPSIES;  Surgeon: Garner Nash, DO;  Location: [facility];  Service: Pulmonary;;   DENTAL SURGERY     with sedative per pt, not sedated   [place]     x7-sedation with 1 delivery [place] N/A 04/28/2021   Procedure: VIDEO BRONCHOSCOPY WITH ENDOBRONCHIAL ULTRASOUND;  Surgeon: Garner Nash, DO;  Location: [facility];  Service: Pulmonary;  Laterality: N/A;   VIDEO BRONCHOSCOPY WITH RADIAL ENDOBRONCHIAL ULTRASOUND  04/28/2021   Procedure: VIDEO BRONCHOSCOPY WITH RADIAL ENDOBRONCHIAL ULTRASOUND;  Surgeon: Garner Nash, DO;  Location: [facility];  Service: Pulmonary;;    Social History   Socioeconomic History   Marital status: Single    Spouse name: Not on file   Number of children: Not on file   Years of education: Not on file   Highest education level: Not on file  Occupational History   Not on file  Tobacco Use   Smoking status: Every Day    Packs/day: 0.25    Years: 40.00    Pack years: 10.00    Types: Cigarettes   Smokeless tobacco: Never  Vaping Use   Vaping Use: Never used  Substance and Sexual Activity  Alcohol use: Yes    Comment: occ   Drug use: No   Sexual activity: Not Currently    Partners: Male    Birth control/protection: Post-menopausal  Other Topics Concern   Not on file  Social History Narrative   Not on file   Social Determinants of Health   Financial Resource Strain: Not on file  Food Insecurity: Not on file  Transportation Needs: Not on file  Physical Activity: Not on file  Stress: Not on file  Social Connections: Not on file  Intimate Partner Violence: Not on file     No Known Allergies   Outpatient Medications Prior to Visit  Medication Sig Dispense Refill    atorvastatin (LIPITOR) 40 MG tablet Take 1 tablet (40 mg total) by mouth daily. 30 tablet 2   dexamethasone (DECADRON) 4 MG tablet Take 1 tablet (4 mg total) by mouth 3 (three) times daily for 7 days, THEN 1 tablet (4 mg total) 2 (two) times daily for 7 days, THEN 0.5 tablets (2 mg total) 2 (two) times daily for 7 days, THEN 0.5 tablets (2 mg total) daily for 7 days. 45.5 tablet 0   guaiFENesin-dextromethorphan (ROBITUSSIN DM) 100-10 MG/5ML syrup Take 10 mLs by mouth every 4 (four) hours as needed for cough. 118 mL 0   lidocaine-prilocaine (EMLA) cream Apply to affected area once 30 g 3   nystatin (MYCOSTATIN) 100000 UNIT/ML suspension Take 5 mLs (500,000 Units total) by mouth 4 (four) times daily. 473 mL 0   ondansetron (ZOFRAN) 8 MG tablet Take 1 tablet (8 mg total) by mouth 2 (two) times daily as needed for refractory nausea / vomiting. Start on day 3 after carboplatin chemo. 30 tablet 1   pantoprazole (PROTONIX) 40 MG tablet Take 1 tablet (40 mg total) by mouth daily. 30 tablet 0   prochlorperazine (COMPAZINE) 10 MG tablet Take 1 tablet (10 mg total) by mouth every 6 (six) hours as needed (Nausea or vomiting). 30 tablet 1   No facility-administered medications prior to visit.    Review of Systems  Constitutional:  Positive for malaise/fatigue. Negative for chills, fever and weight loss.  HENT:  Negative for hearing loss, sore throat and tinnitus.   Eyes:  Negative for blurred vision and double vision.  Respiratory:  Positive for shortness of breath. Negative for cough, hemoptysis, sputum production, wheezing and stridor.   Cardiovascular:  Negative for chest pain, palpitations, orthopnea, leg swelling and PND.  Gastrointestinal:  Negative for abdominal pain, constipation, diarrhea, heartburn, nausea and vomiting.  Genitourinary:  Negative for dysuria, hematuria and urgency.  Musculoskeletal:  Negative for joint pain and myalgias.  Skin:  Negative for itching and rash.  Neurological:   Positive for weakness. Negative for dizziness, tingling and headaches.  Endo/Heme/Allergies:  Negative for environmental allergies. Does not bruise/bleed easily.  Psychiatric/Behavioral:  Negative for depression. The patient is not nervous/anxious and does not have insomnia.   All other systems reviewed and are negative.   Objective:  Physical Exam Vitals reviewed.  Constitutional:      General: She is not in acute distress.    Appearance: She is well-developed.     Comments: Elderly frail thin  HENT:     Head: Normocephalic and atraumatic.  Eyes:     General: No scleral icterus.    Conjunctiva/sclera: Conjunctivae normal.     Pupils: Pupils are equal, round, and reactive to light.  Neck:     Vascular: No JVD.     Trachea: No tracheal deviation.  Cardiovascular:     Rate and Rhythm: Normal rate and regular rhythm.     Heart sounds: Normal heart sounds. No murmur heard. Pulmonary:     Effort: Pulmonary effort is normal. No tachypnea, accessory muscle usage or respiratory distress.     Breath sounds: No stridor. No wheezing, rhonchi or rales.  Abdominal:     General: There is no distension.     Palpations: Abdomen is soft.     Tenderness: There is no abdominal tenderness.  Musculoskeletal:        General: No tenderness.     Cervical back: Neck supple.  Lymphadenopathy:     Cervical: No cervical adenopathy.  Skin:    General: Skin is warm and dry.     Capillary Refill: Capillary refill takes less than 2 seconds.     Findings: No rash.  Neurological:     Mental Status: She is alert and oriented to person, place, and time.     Motor: Weakness present.     Comments: Weakness in the left grip strength  Psychiatric:        Behavior: Behavior normal.     Vitals:   05/19/21 1336  BP: 138/60  Pulse: 83  Temp: 98.1 F (36.7 C)  TempSrc: Oral  SpO2: 96%  Weight: 115 lb 3.2 oz (52.3 kg)  Height: 5' 5"  (1.651 m)   96% on RA BMI Readings from Last 3 Encounters:   05/19/21 19.17 kg/m  05/05/21 20.14 kg/m  05/02/21 20.20 kg/m   Wt Readings from Last 3 Encounters:  05/19/21 115 lb 3.2 oz (52.3 kg)  05/05/21 121 lb (54.9 kg)  05/02/21 121 lb 6.4 oz (55.1 kg)     CBC    Component Value Date/Time   WBC 19.5 (H) 05/05/2021 1240   WBC 18.2 (H) 04/28/2021 0109   RBC 5.03 05/05/2021 1240   HGB 14.6 05/05/2021 1240   HCT 43.2 05/05/2021 1240   PLT 398 05/05/2021 1240   MCV 85.9 05/05/2021 1240   MCH 29.0 05/05/2021 1240   MCHC 33.8 05/05/2021 1240   RDW 13.1 05/05/2021 1240   LYMPHSABS 0.9 05/05/2021 1240   MONOABS 0.8 05/05/2021 1240   EOSABS 0.0 05/05/2021 1240   BASOSABS 0.0 05/05/2021 1240    Chest Imaging: Clear medicine PET scan: 07/30/1446: Hypermetabolic left upper lobe mass with associated contralateral hypermetabolic paratracheal lymph nodes concerning for hilar metastasis and advanced age bronchogenic carcinoma. The patient's images have been independently reviewed by me.  v  Pulmonary Functions Testing Results: No flowsheet data found.  FeNO:   Pathology:   Echocardiogram:   Heart Catheterization:     Assessment & Plan:     ICD-10-CM   1. Squamous cell lung cancer, left (HCC)  C34.92     2. S/P bronchoscopy with biopsy  Z98.890     3. Brain metastasis ([place])  C79.31       Discussion:  This is a 71 year old female with stage IV non-small cell lung cancer undergoing brain radiation and plans for chemotherapy.  Port placement scheduled for next week.  Here today for follow-up after recent hospitalization and bronchoscopy with biopsy.  Plan: She likely has underlying COPD. No prior PFTs on file. However from respiratory standpoint she is doing okay at this time. She has used as needed albuterol in the past. She is currently on prednisone/Decadron for her brain met. She is breathing well at this time. At some point time she may benefit regarding inhaler but I think we  can hold off on this at the  moment. Patient to follow-up with [place] in 6 months or as needed   Current Outpatient Medications:    atorvastatin (LIPITOR) 40 MG tablet, Take 1 tablet (40 mg total) by mouth daily., Disp: 30 tablet, Rfl: 2   dexamethasone (DECADRON) 4 MG tablet, Take 1 tablet (4 mg total) by mouth 3 (three) times daily for 7 days, THEN 1 tablet (4 mg total) 2 (two) times daily for 7 days, THEN 0.5 tablets (2 mg total) 2 (two) times daily for 7 days, THEN 0.5 tablets (2 mg total) daily for 7 days., Disp: 45.5 tablet, Rfl: 0   guaiFENesin-dextromethorphan (ROBITUSSIN DM) 100-10 MG/5ML syrup, Take 10 mLs by mouth every 4 (four) hours as needed for cough., Disp: 118 mL, Rfl: 0   lidocaine-prilocaine (EMLA) cream, Apply to affected area once, Disp: 30 g, Rfl: 3   nystatin (MYCOSTATIN) 100000 UNIT/ML suspension, Take 5 mLs (500,000 Units total) by mouth 4 (four) times daily., Disp: 473 mL, Rfl: 0   ondansetron (ZOFRAN) 8 MG tablet, Take 1 tablet (8 mg total) by mouth 2 (two) times daily as needed for refractory nausea / vomiting. Start on day 3 after carboplatin chemo., Disp: 30 tablet, Rfl: 1   pantoprazole (PROTONIX) 40 MG tablet, Take 1 tablet (40 mg total) by mouth daily., Disp: 30 tablet, Rfl: 0   prochlorperazine (COMPAZINE) 10 MG tablet, Take 1 tablet (10 mg total) by mouth every 6 (six) hours as needed (Nausea or vomiting)., Disp: 30 tablet, Rfl: 1   Garner Nash, DO [facility] 05/19/2021 1:39 PM

## 2021-05-19 NOTE — Addendum Note (Signed)
Encounter addended by: Kary Kos, MD on: 05/19/2021 4:28 PM  Actions taken: Clinical Note Signed

## 2021-05-22 ENCOUNTER — Ambulatory Visit (HOSPITAL_COMMUNITY)
Admission: RE | Admit: 2021-05-22 | Discharge: 2021-05-22 | Disposition: A | Discharge status: Home / Self Care | Payer: Medicare Other | Source: Ambulatory Visit | Attending: Hematology | Admitting: Hematology

## 2021-05-22 ENCOUNTER — Encounter (HOSPITAL_COMMUNITY): Payer: Self-pay

## 2021-05-22 ENCOUNTER — Other Ambulatory Visit: Payer: Self-pay

## 2021-05-22 DIAGNOSIS — E785 Hyperlipidemia, unspecified: Secondary | ICD-10-CM | POA: Diagnosis not present

## 2021-05-22 DIAGNOSIS — C7931 Secondary malignant neoplasm of brain: Secondary | ICD-10-CM | POA: Diagnosis not present

## 2021-05-22 DIAGNOSIS — C3490 Malignant neoplasm of unspecified part of unspecified bronchus or lung: Secondary | ICD-10-CM | POA: Insufficient documentation

## 2021-05-22 DIAGNOSIS — C3492 Malignant neoplasm of unspecified part of left bronchus or lung: Secondary | ICD-10-CM

## 2021-05-22 DIAGNOSIS — Z87891 Personal history of nicotine dependence: Secondary | ICD-10-CM | POA: Insufficient documentation

## 2021-05-22 HISTORY — PX: IR IMAGING GUIDED PORT INSERTION: IMG5740

## 2021-05-22 IMAGING — US IR IMAGING GUIDED PORT INSERTION
1 series · 2 of 2 positions shown · non-contrast
Comparison: none

INDICATION: 71-year-old female with recently diagnosed left upper lobe non-small
cell lung cancer metastatic to the brain. She presents for port
catheter placement to facilitate chemotherapy.

[Series 1: ir fluoro/shunt/fist · 2 of 2 slices shown]
[im 1/2]
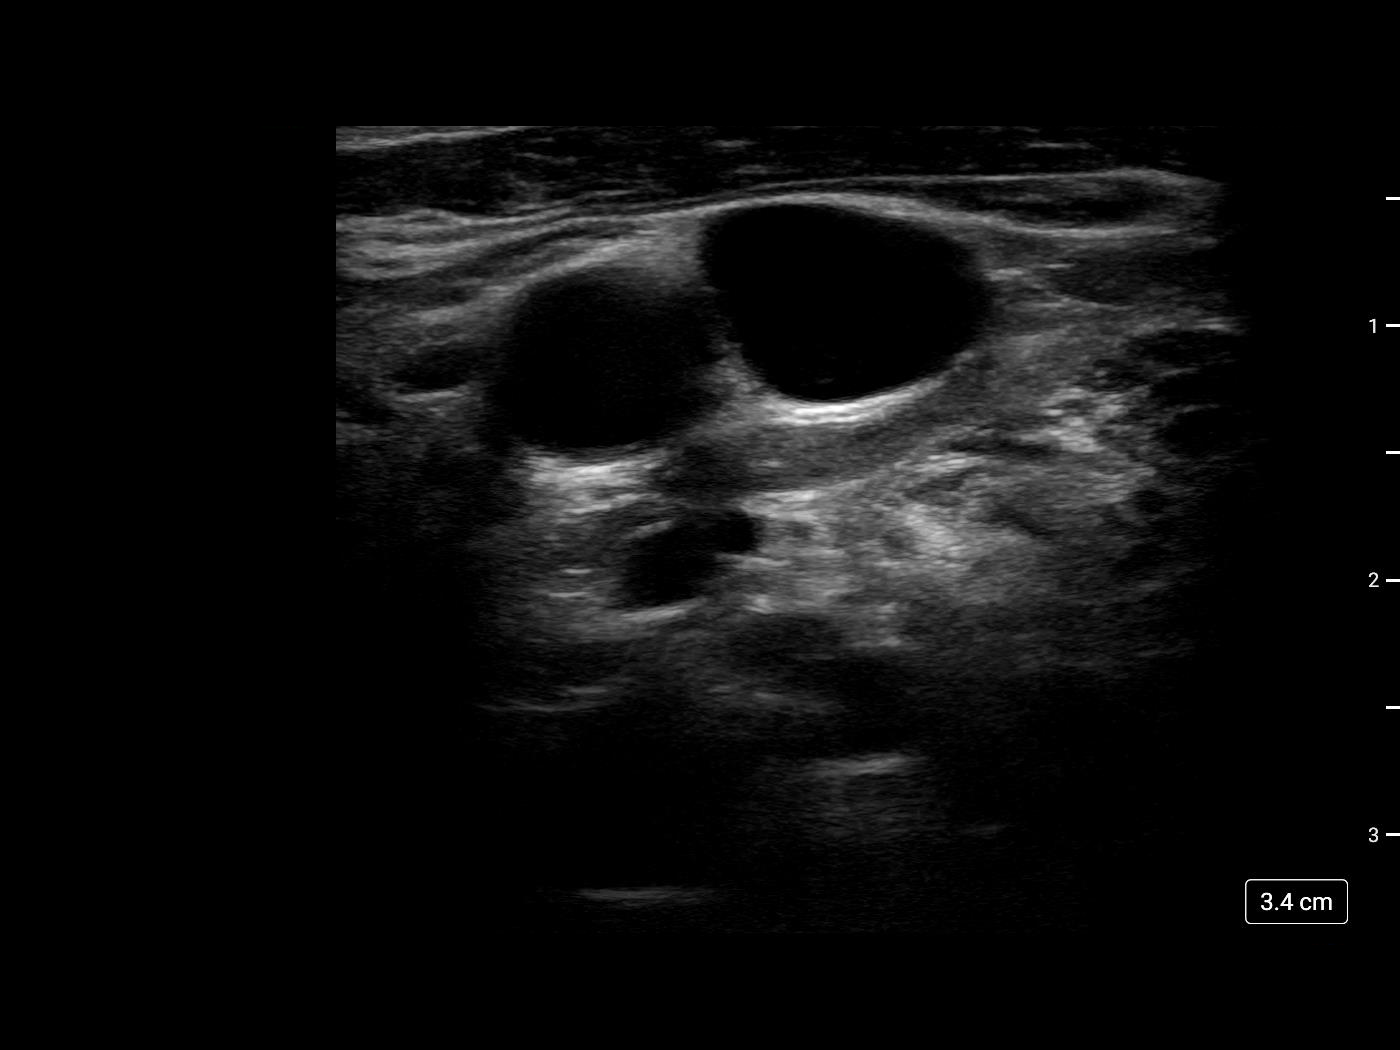
[im 2/2]
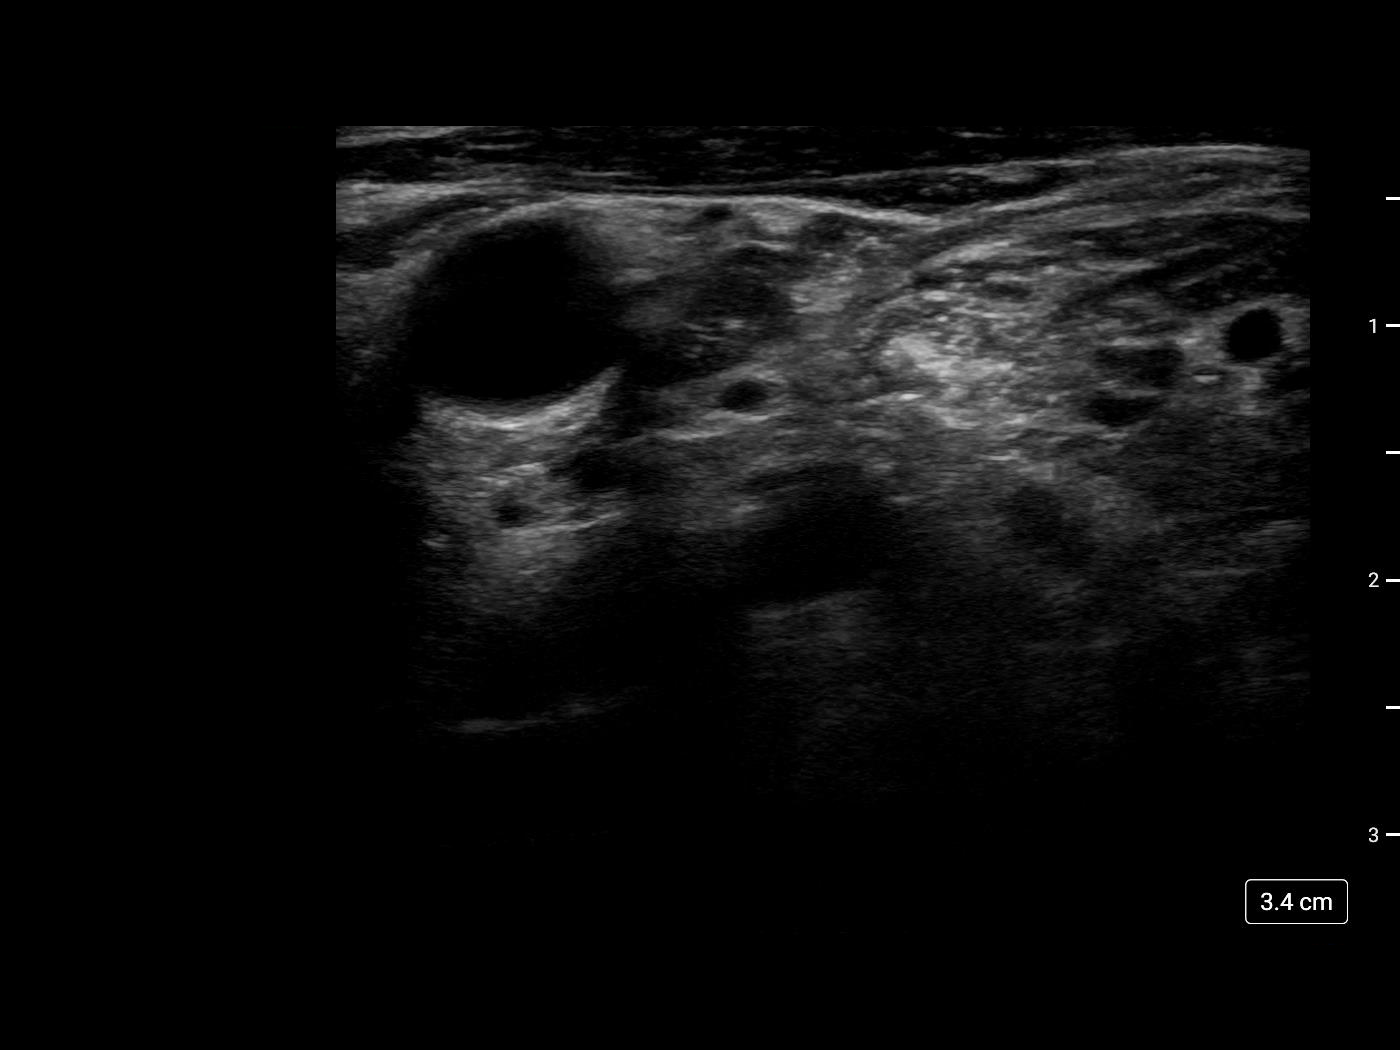

[2 of 2 positions shown; findings below may reference images not displayed]

EXAM:
IMPLANTED PORT A CATH PLACEMENT WITH ULTRASOUND AND FLUOROSCOPIC
GUIDANCE

MEDICATIONS:
None.

ANESTHESIA/SEDATION:
Versed 1.5 mg IV; Fentanyl 50 mcg IV;

Moderate Sedation Time:  17 minutes

The patient was continuously monitored during the procedure by the
interventional radiology nurse under my direct supervision.

FLUOROSCOPY TIME:  0 minutes, 36 seconds (1 mGy)

COMPLICATIONS:
None immediate.

PROCEDURE:
The right neck and chest was prepped with chlorhexidine, and draped
in the usual sterile fashion using maximum barrier technique (cap
and mask, sterile gown, sterile gloves, large sterile sheet, hand
hygiene and cutaneous antiseptic). Local anesthesia was attained by
infiltration with 1% lidocaine with epinephrine.

Ultrasound demonstrated patency of the right internal jugular vein,
and this was documented with an image. Under real-time ultrasound
guidance, this vein was accessed with a 21 gauge micropuncture
needle and image documentation was performed. A small dermatotomy
was made at the access site with an 11 scalpel. A 0.018" wire was
advanced into the SVC and the access needle exchanged for a 4F
micropuncture vascular sheath. The 0.018" wire was then removed and
a 0.035" wire advanced into the IVC.



The venous access site was then serially dilated and a peel away
vascular sheath placed over the wire. The wire was removed and the
port catheter advanced into position under fluoroscopic guidance.
The catheter tip is positioned in the superior cavoatrial junction.
This was documented with a spot image. The portacatheter was then
tested and found to flush and aspirate well. The port was flushed
with saline followed by 100 units/mL heparinized saline.

The pocket was then closed in two layers using first subdermal
inverted interrupted absorbable sutures followed by a running
subcuticular suture. The epidermis was then sealed with Dermabond.
The dermatotomy at the venous access site was also closed with
Dermabond.
IMPRESSION: Successful placement of a right IJ approach Power Port with
ultrasound and fluoroscopic guidance. The catheter is ready for use.

## 2021-05-22 MED ORDER — MIDAZOLAM HCL 2 MG/2ML IJ SOLN
INTRAMUSCULAR | Status: AC
  Filled 2021-05-22: qty 2

## 2021-05-22 MED ORDER — LIDOCAINE-EPINEPHRINE 1 %-1:100000 IJ SOLN
INTRAMUSCULAR | Status: AC | PRN
  Administered 2021-05-22: 10 mL via INTRADERMAL

## 2021-05-22 MED ORDER — FENTANYL CITRATE (PF) 100 MCG/2ML IJ SOLN
INTRAMUSCULAR | Status: AC | PRN
  Administered 2021-05-22: 50 ug via INTRAVENOUS

## 2021-05-22 MED ORDER — FENTANYL CITRATE (PF) 100 MCG/2ML IJ SOLN
INTRAMUSCULAR | Status: AC
  Filled 2021-05-22: qty 2

## 2021-05-22 MED ORDER — HEPARIN SOD (PORK) LOCK FLUSH 100 UNIT/ML IV SOLN
INTRAVENOUS | Status: AC
  Filled 2021-05-22: qty 5

## 2021-05-22 MED ORDER — MIDAZOLAM HCL 2 MG/2ML IJ SOLN
INTRAMUSCULAR | Status: AC | PRN
Start: 2021-05-22 — End: 2021-05-22
  Administered 2021-05-22: .5 mg via INTRAVENOUS

## 2021-05-22 MED ORDER — MIDAZOLAM HCL 2 MG/2ML IJ SOLN
INTRAMUSCULAR | Status: AC | PRN
  Administered 2021-05-22: 1 mg via INTRAVENOUS

## 2021-05-22 MED ORDER — SODIUM CHLORIDE 0.9 % IV SOLN: INTRAVENOUS | Status: DC

## 2021-05-22 MED ORDER — LIDOCAINE-EPINEPHRINE 1 %-1:100000 IJ SOLN
INTRAMUSCULAR | Status: AC
  Filled 2021-05-22: qty 1

## 2021-05-22 NOTE — Procedures (Signed)
Interventional Radiology Procedure Note  Procedure: Placement of a right IJ approach single lumen PowerPort.  Tip is positioned at the superior cavoatrial junction and catheter is ready for immediate use.  Complications: No immediate Recommendations:  - Ok to shower tomorrow - Do not submerge for 7 days - Routine line care   Signed,  Adonica Fukushima K. Jonnette Nuon, MD   

## 2021-05-22 NOTE — Consult Note (Signed)
Chief Complaint: Patient was seen in consultation today for port a cath placement  Referring Physician(s): Feng,Yan  Supervising Physician: Jacqulynn Cadet  Patient Status: [facility] - Out-pt  History of Present Illness: Cynthia Gonzalez is a 71 y.o. female with PMH HLD and longstanding history of tobacco use.  She recently presented to the hospital with strokelike symptoms.  MRI of the brain found a 1.9 cm metastatic lesion to the brain.  Patient had CT scan of the chest which revealed lung mass with no significant adenopathy.  Patient was taken for Robotic assisted navigational bronchoscopy with tissue sampling.  Patient was diagnosed with stage IV non-small cell lung cancer which was positive for cytokeratin 5/6 and p63, negative TTF-1 consistent with squamous cell carcinoma of the lung.  Patient has already completed brain radiation with Dr. Tammi Klippel.  She  has established care with medical oncology. She presents today for port a cath placement to assist with treatment.   Past Medical History:  Diagnosis Date   Allergy    Blood transfusion without reported diagnosis 1976   Hyperlipidemia     Past Surgical History:  Procedure Laterality Date   BRONCHIAL BIOPSY  04/28/2021   Procedure: BRONCHIAL BIOPSIES;  Surgeon: Garner Nash, DO;  Location: [place] [facility];  Service: Pulmonary;;   BRONCHIAL BRUSHINGS  04/28/2021   Procedure: BRONCHIAL BRUSHINGS;  Surgeon: Garner Nash, DO;  Location: [place] [facility];  Service: Pulmonary;;   BRONCHIAL NEEDLE ASPIRATION BIOPSY  04/28/2021   Procedure: BRONCHIAL NEEDLE ASPIRATION BIOPSIES;  Surgeon: Garner Nash, DO;  Location: [place] [facility];  Service: Pulmonary;;   DENTAL SURGERY     with sedative per pt, not sedated   [place]     x7-sedation with 1 delivery [place] N/A 04/28/2021   Procedure: VIDEO BRONCHOSCOPY WITH ENDOBRONCHIAL ULTRASOUND;  Surgeon: Garner Nash, DO;  Location: [place];  Service: Pulmonary;  Laterality: N/A;   VIDEO BRONCHOSCOPY WITH RADIAL ENDOBRONCHIAL ULTRASOUND  04/28/2021   Procedure: VIDEO BRONCHOSCOPY WITH RADIAL ENDOBRONCHIAL ULTRASOUND;  Surgeon: Garner Nash, DO;  Location: [place] [facility];  Service: Pulmonary;;    Allergies: Patient has no known allergies.  Medications: Prior to Admission medications   Medication Sig Start Date End Date Taking? Authorizing Provider  atorvastatin (LIPITOR) 40 MG tablet Take 1 tablet (40 mg total) by mouth daily. 04/28/21 07/27/21 Yes Dahal, Marlowe Aschoff, MD  dexamethasone (DECADRON) 4 MG tablet Take 1 tablet (4 mg total) by mouth 3 (three) times daily for 7 days, THEN 1 tablet (4 mg total) 2 (two) times daily for 7 days, THEN 0.5 tablets (2 mg total) 2 (two) times daily for 7 days, THEN 0.5 tablets (2 mg total) daily for 7 days. 05/08/21 06/05/21 Yes Tyler Pita, MD  guaiFENesin-dextromethorphan (ROBITUSSIN DM) 100-10 MG/5ML syrup Take 10 mLs by mouth every 4 (four) hours as needed for cough. 04/28/21  Yes Dahal, Marlowe Aschoff, MD  pantoprazole (PROTONIX) 40 MG tablet Take 1 tablet (40 mg total) by mouth daily. 04/28/21 05/28/21 Yes Dahal, Marlowe Aschoff, MD  lidocaine-prilocaine (EMLA) cream Apply to affected area once 05/05/21   Truitt Merle, MD  nystatin (MYCOSTATIN) 100000 UNIT/ML suspension Take 5 mLs (500,000 Units total) by mouth 4 (four) times daily. 05/11/21   Tyler Pita, MD  ondansetron (ZOFRAN) 8 MG tablet Take 1 tablet (8 mg total) by mouth 2 (two) times daily as needed for refractory nausea / vomiting. Start on day 3 after carboplatin chemo.  05/05/21   Truitt Merle, MD  prochlorperazine (COMPAZINE) 10 MG tablet Take 1 tablet (10 mg total) by mouth every 6 (six) hours as needed (Nausea or vomiting). 05/05/21   Truitt Merle, MD     Family History  Problem Relation Age of Onset   Cirrhosis Mother    Cancer Father    Colon cancer Neg Hx    Rectal cancer Neg Hx    Stomach cancer Neg Hx     Social History    Socioeconomic History   Marital status: Single    Spouse name: Not on file   Number of children: Not on file   Years of education: Not on file   Highest education level: Not on file  Occupational History   Not on file  Tobacco Use   Smoking status: Every Day    Packs/day: 0.25    Years: 40.00    Pack years: 10.00    Types: Cigarettes   Smokeless tobacco: Never  Vaping Use   Vaping Use: Never used  Substance and Sexual Activity   Alcohol use: Yes    Comment: occ   Drug use: No   Sexual activity: Not Currently    Partners: Male    Birth control/protection: Post-menopausal  Other Topics Concern   Not on file  Social History Narrative   Not on file   Social Determinants of Health   Financial Resource Strain: Not on file  Food Insecurity: Not on file  Transportation Needs: Not on file  Physical Activity: Not on file  Stress: Not on file  Social Connections: Not on file      Review of Systems denies fever,HA,CP,worsening dyspnea, abd/back pain,N/V or bleeding; she does have occ cough, weakness  Vital Signs: BP (!) 158/69 (BP Location: Right Arm)    Pulse 84    Temp 98.4 F (36.9 C) (Oral)    Resp 15    SpO2 99%   Physical Exam awake/alert; chest- distant BS bilat; heart- RRR; abd- soft,+BS,NT; trace pretibial edema; rt hand grip weakness  Imaging: CT HEAD WO CONTRAST  Result Date: 04/26/2021 CLINICAL DATA:  Right-sided weakness, stroke suspected EXAM: CT HEAD WITHOUT CONTRAST TECHNIQUE: Contiguous axial images were obtained from the base of the skull through the vertex without intravenous contrast. COMPARISON:  None. FINDINGS: Brain: There is an approximately 1.7 cm intra-axial mass lesion in the posterior left frontal lobe with moderate surrounding vasogenic edema and minimal associated sulcal effacement. No additional definite brain lesions or areas of vasogenic edema visualized. No acute intracranial hemorrhage. No hydrocephalus. No extra-axial fluid collections or  herniation. Vascular: Calcified plaques in the carotid siphons. Skull: Normal. Negative for fracture or focal lesion. Sinuses/Orbits: Mild chronic mucosal thickening in the maxillary sinuses. No acute process identified. Other: Partially visualized 2 x 1.9 cm rounded hypodense mass in the right neck parapharyngeal space at the level of the nasopharynx. IMPRESSION: 1. Approximally 1.7 cm left frontal lobe mass with moderate surrounding vasogenic edema and minimal mass effect. May represent metastatic or primary lesion. MRI brain with contrast recommended as well as oncologic workup as indicated. 2. Partially visualized hypodense mass in the right neck parapharyngeal space. Indeterminate but could also represent metastasis given the brain findings. Could also be evaluated further with CT neck with contrast. Electronically Signed   By: Ofilia Neas M.D.   On: 04/26/2021 15:57   CT Soft Tissue Neck W Contrast  Result Date: 04/26/2021 CLINICAL DATA:  Right parapharyngeal mass noted on head CT EXAM: CT NECK  WITH CONTRAST TECHNIQUE: Multidetector CT imaging of the neck was performed using the standard protocol following the bolus administration of intravenous contrast. CONTRAST:  156mL OMNIPAQUE IOHEXOL 300 MG/ML  SOLN COMPARISON:  No prior neck CT, correlation is made with 04/26/2021 CT head FINDINGS: Pharynx and larynx: Normal. No mass or swelling. Salivary glands: No inflammation, mass, or stone. Thyroid: Subcentimeter lesion in the left thyroid lobe (series 3, image 80). Otherwise negative. Lymph nodes: Heterogeneously enhancing, centrally hypodense mass in the right parapharyngeal space, favored to represent an enlarged right level 2A lymph node, measuring up to 2.2 x 2.8 x 3.1 cm (AP x TR x CC) (series 3, image 37 and series 7, image 63). No other enlarged or abnormal appearing lymph nodes. Vascular: Negative. Limited intracranial: Negative. Previously noted left frontal lobe mass is not included in the field  of view. Visualized orbits: Status post bilateral lens replacements. Otherwise negative. Mastoids and visualized paranasal sinuses: Mucosal thickening in the bilateral maxillary sinuses. Otherwise negative. Skeleton: Degenerative changes in the cervical spine. No acute osseous abnormality. Upper chest: Please see same-day CT chest Other: None. IMPRESSION: 1. Heterogeneously enhancing, centrally hypodense mass in the right parapharyngeal space, favored to represent an enlarged, necrotic right level 2A lymph node, concerning for metastatic disease. Biopsy is recommended. 2. No other acute finding in the neck. Electronically Signed   By: Merilyn Baba M.D.   On: 04/26/2021 18:48   MR BRAIN W WO CONTRAST  Result Date: 04/27/2021 CLINICAL DATA:  Metastatic disease evaluation Treatment planning for possible SRS EXAM: MRI HEAD WITHOUT AND WITH CONTRAST TECHNIQUE: Multiplanar, multiecho pulse sequences of the brain and surrounding structures were obtained without and with intravenous contrast. CONTRAST:  5.35mL GADAVIST GADOBUTROL 1 MMOL/ML IV SOLN COMPARISON:  MRI from the same day. FINDINGS: Brain: Unchanged 1.9 cm enhancing lesion within the left frontoparietal region, characterized on same day MRI. Similar surrounding edema without midline shift. No other enhancing intraparenchymal lesions identified. No evidence of hydrocephalus, acute hemorrhage, acute infarct, or extra-axial fluid collection. Right parapharyngeal space mass is T2 hyperintense and heterogeneously enhancing, further characterized on same day CT. Vascular: Major arterial flow voids are maintained at the skull base. Skull and upper cervical spine: Similar probable benign vertebral venous malformations in the upper cervical spine, which are T1 hyperintense. Sinuses/Orbits: Mild paranasal sinus mucosal thickening. Unremarkable orbits. Other: No sizable mastoid effusions. IMPRESSION: 1. Redemonstrated 1.9 cm enhancing intraparenchymal mass in the  posterior left frontoparietal region with surrounding vasogenic edema, concerning for an intracranial metastasis given findings on recent CT chest. 2. Partially imaged right parapharyngeal neck mass, further characterized on same day CT neck. This could represent a nodal metastasis or primary malignancy such as a salivary gland neoplasm or neurogenic tumor. Electronically Signed   By: Margaretha Sheffield M.D.   On: 04/27/2021 17:17   MR Brain W and Wo Contrast  Result Date: 04/27/2021 CLINICAL DATA:  Initial evaluation for brain CNS neoplasm. EXAM: MRI HEAD WITHOUT AND WITH CONTRAST TECHNIQUE: Multiplanar, multiecho pulse sequences of the brain and surrounding structures were obtained without and with intravenous contrast. CONTRAST:  55mL GADAVIST GADOBUTROL 1 MMOL/ML IV SOLN COMPARISON:  Multiple previous CTs from 04/26/2021. FINDINGS: Brain: Cerebral volume within normal limits. No evidence for acute or subacute infarct. No acute or chronic intracranial hemorrhage. Intraparenchymal mass positioned along the gray-white matter junction at the posterior left frontoparietal region measures 1.9 x 1.5 x 1.9 cm (series 18, image 37). Surrounding T2/FLAIR signal intensity consistent with vasogenic edema. No midline shift.  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 No other mass lesion, abnormal enhancement, or visible intracranial metastatic disease. Ventricles normal size without hydrocephalus. No extra-axial fluid collection. Pituitary gland suprasellar region within normal limits. Vascular: Major intracranial vascular flow voids are maintained. Skull and upper cervical spine: Craniocervical junction within normal limits. Few scattered probable benign hemangiomata noted within the visualized upper cervical spine and about the skull base. No worrisome focal marrow replacing lesion. No scalp soft tissue abnormality. Sinuses/Orbits: Patient status post bilateral  ocular lens replacement. Globes and orbital soft tissues demonstrate no acute finding. Mild mucosal thickening noted within the ethmoidal air cells and maxillary sinuses. Trace bilateral mastoid effusions noted, of doubtful significance. Inner ear structures grossly normal. Other: 2.1 x 2.3 cm heterogeneous mass noted within the right parapharyngeal space, better evaluated on prior neck CT. IMPRESSION: 1. 1.9 x 1.5 x 1.9 cm intraparenchymal mass involving the posterior left frontoparietal region with surrounding vasogenic edema. 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 2. No other acute intracranial process. 3. 2.1 x 2.3 cm heterogeneous mass within the right parapharyngeal space, better evaluated on prior neck CT. Electronically Signed   By: Jeannine Boga M.D.   On: 04/27/2021 05:05   CT CHEST ABDOMEN PELVIS W CONTRAST  Result Date: 04/26/2021 CLINICAL DATA:  Right parapharyngeal mass, left temporal lobe mass, concern for metastatic disease EXAM: CT CHEST, ABDOMEN, AND PELVIS WITH CONTRAST TECHNIQUE: Multidetector CT imaging of the chest, abdomen and pelvis was performed following the standard protocol during bolus administration of intravenous contrast. CONTRAST:  161mL OMNIPAQUE IOHEXOL 300 MG/ML  SOLN COMPARISON:  None. FINDINGS: CT CHEST FINDINGS Cardiovascular: The heart is unremarkable without pericardial effusion. While this examination is not optimized for evaluation of the pulmonary vasculature, I do not see any filling defects or pulmonary emboli. No evidence of thoracic aortic aneurysm or dissection. Moderate atherosclerosis throughout the aorta and coronary vasculature. Mediastinum/Nodes: Pathologic adenopathy within the mediastinum and hila. Precarinal lymph node measures up to 15 mm in short axis, reference image 29/3. Thyroid, trachea, and esophagus are unremarkable. Lungs/Pleura: There is a spiculated left upper lobe mass measuring  4.3 x 3.9 x 3.0 cm, consistent with primary lung cancer. Right hilar mass versus adenopathy identified, reference image 74/4, measuring 1.6 x 1.2 by 1.9 cm. This results in partial obstruction of right upper lobe segmental bronchi. No airspace disease, effusion, or pneumothorax. Musculoskeletal: There are no acute or destructive bony lesions. Reconstructed images demonstrate no additional findings. CT ABDOMEN PELVIS FINDINGS Hepatobiliary: Small gallstones without cholecystitis. The liver is unremarkable. Pancreas: Unremarkable. No pancreatic ductal dilatation or surrounding inflammatory changes. Spleen: Normal in size without focal abnormality. Adrenals/Urinary Tract: Thickening of the medial limb of the left adrenal gland measuring up to 8 mm. No discrete right adrenal findings. Metastatic disease is not excluded. The kidneys enhance normally and symmetrically. Bladder is markedly distended, without filling defect. Stomach/Bowel: Nonspecific wall thickening of the gastric cardia measuring up to 2.1 cm. No bowel obstruction or ileus. Vascular/Lymphatic: 3.7 cm infrarenal abdominal aortic aneurysm, with extensive mural thrombus. Diffuse atherosclerosis. 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 No pathologic adenopathy within the abdomen or pelvis. Reproductive: Uterus and bilateral adnexa are unremarkable. Other: No free fluid or free gas.  No abdominal wall hernia. Musculoskeletal: No acute or destructive bony lesions. Reconstructed images demonstrate no additional findings. IMPRESSION: 1. Large spiculated left  upper lobe mass consistent with primary lung cancer. 2. Right hilar mass versus adenopathy, with partial obstruction of segmental right upper lobe bronchi. 3. Mediastinal and hilar adenopathy consistent with nodal metastases. 4. Nonspecific left adrenal thickening, metastatic disease not excluded. 5. Nonspecific wall  thickening of the gastric cardia. 6. 3.7 cm infrarenal abdominal aortic aneurysm. Recommend follow-up every 2 years. Reference: J Am Coll Radiol 2637;85:885-027. 7. 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 The bilateral profundus femoral arteries remain patent. 8. Cholelithiasis without cholecystitis. 9.  Aortic Atherosclerosis (ICD10-I70.0). Electronically Signed   By: Randa Ngo M.D.   On: 04/26/2021 18:52   NM PET Image Initial (PI) Skull Base To Thigh  Result Date: 05/18/2021 CLINICAL DATA:  initial treatment strategy for non-small cell lung cancer. EXAM: NUCLEAR MEDICINE PET SKULL BASE TO THIGH TECHNIQUE: 5.9 mCi F-18 FDG was injected intravenously. Full-ring PET imaging was performed from the skull base to thigh after the radiotracer. CT data was obtained and used for attenuation correction and anatomic localization. Fasting blood glucose: 98 mg/dl COMPARISON:  None. FINDINGS: Mediastinal blood pool activity: SUV max 2.5 Liver activity: SUV max NA NECK: No hypermetabolic lymph nodes in the neck. Incidental CT findings: none CHEST: Rounded hypermetabolic mass in the LEFT upper lobe measures 3.2 x 2.9 cm with intense metabolic activity SUV max equal 32.9. Small gas cavitation within the mass. No additional hypermetabolic nodules. Hypermetabolic contralateral RIGHT lower paratracheal and hilar hyper hypermetabolic nodes. RIGHT paratracheal node with SUV max equal 8.5. RIGHT hilar node with SUV max 8.6. Smaller more superior hypermetabolic nodes in the mediastinum. Incidental CT findings: none ABDOMEN/PELVIS: No abnormal hypermetabolic activity within the liver, pancreas, adrenal glands, or spleen. No hypermetabolic lymph nodes in the abdomen or pelvis. Incidental CT findings: none SKELETON: No focal hypermetabolic activity to suggest skeletal metastasis. Incidental CT findings: none IMPRESSION: 1. Hypermetabolic rounded mass in the LEFT upper lobe most consistent with  bronchogenic carcinoma. 2. Contralateral hypermetabolic paratracheal lymph node and RIGHT hilar lymph node metastasis. 3. No evidence of metastatic disease outside the thorax. Electronically Signed   By: Suzy Bouchard M.D.   On: 05/18/2021 16:59   DG Chest Port 1 View  Result Date: 04/28/2021 CLINICAL DATA:  Status post bronch EXAM: PORTABLE CHEST 1 VIEW COMPARISON:  CT chest 04/26/2021 FINDINGS: Cardiac silhouette and mediastinal contours are within normal limits with calcification seen within the aortic arch. There is an opacity overlying the left upper lung measuring up to approximately 5 cm with spiculated margins corresponding to the mass seen on CT chest concerning for primary lung cancer. No pleural effusion or pneumothorax. No acute skeletal abnormality. IMPRESSION: Redemonstration of left upper lung spiculated mass concerning for primary malignancy. Electronically Signed   By: Yvonne Kendall   On: 04/28/2021 13:52   DG C-ARM BRONCHOSCOPY  Result Date: 04/28/2021 C-ARM BRONCHOSCOPY: Fluoroscopy was utilized by the requesting physician.  No radiographic interpretation.    Labs:  CBC: Recent Labs    04/26/21 1608 04/27/21 0220 04/28/21 0109 05/05/21 1240  WBC 6.9 3.3* 18.2* 19.5*  HGB 14.0 13.1 12.3 14.6  HCT 41.6 38.7 37.0 43.2  PLT 461* 418* 446* 398    COAGS: Recent Labs    04/26/21 1608  INR 0.9  APTT 29    BMP: Recent Labs    04/26/21 1608 04/27/21 0220 05/05/21 1240  NA 135 135 136  K 3.8 4.2 4.0  CL 101 105 102  CO2 24 21* 23  GLUCOSE 78 171* 125*  BUN 10 9 19   CALCIUM 9.0 9.5 8.3*  CREATININE 0.79 0.68 0.80  GFRNONAA >60 >60 >60    LIVER FUNCTION TESTS: Recent Labs    04/26/21 1608 04/27/21 0220 04/28/21 0109 05/05/21 1240  BILITOT 0.3 0.4 0.2* 0.3  AST 12* 12* 15 13*  ALT 7 8 10 16   ALKPHOS 102 105 91 80  PROT 7.6 6.7 6.4* 6.3*  ALBUMIN 3.7 3.3* 3.1* 3.6    TUMOR MARKERS: No results for input(s): AFPTM, CEA, CA199, CHROMGRNA in the last  8760 hours.  Assessment and Plan: 71 y.o. female with PMH HLD and longstanding history of tobacco use.  She recently presented to the hospital with strokelike symptoms.  MRI of the brain found a 1.9 cm metastatic lesion to the brain.  Patient had CT scan of the chest which revealed lung mass with no significant adenopathy.  Patient was taken for Robotic assisted navigational bronchoscopy with tissue sampling.  Patient was diagnosed with stage IV non-small cell lung cancer which was positive for cytokeratin 5/6 and p63, negative TTF-1 consistent with squamous cell carcinoma of the lung.  Patient has already completed brain radiation with Dr. Tammi Klippel.  She  has established care with medical oncology. She presents today for port a cath placement to assist with treatment.Risks and benefits of image guided port-a-catheter placement was discussed with the patient including, but not limited to bleeding, infection, pneumothorax, or fibrin sheath development and need for additional procedures.  All of the patient's questions were answered, patient is agreeable to proceed. Consent signed and in chart.    Thank you for this interesting consult.  I greatly enjoyed meeting NATASSIA GUTHRIDGE and look forward to participating in their care.  A copy of this report was sent to the requesting provider on this date.  Electronically Signed: D. Rowe Robert, PA-C 05/22/2021, 12:50 PM   I spent a total of   25 minutes  in face to face in clinical consultation, greater than 50% of which was counseling/coordinating care for port a cath placement

## 2021-05-23 MED FILL — Dexamethasone Sodium Phosphate Inj 100 MG/10ML: INTRAMUSCULAR | Qty: 1 | Status: AC

## 2021-05-24 ENCOUNTER — Inpatient Hospital Stay (HOSPITAL_BASED_OUTPATIENT_CLINIC_OR_DEPARTMENT_OTHER): Payer: Medicare Other | Admitting: Hematology

## 2021-05-24 ENCOUNTER — Inpatient Hospital Stay: Payer: Medicare Other

## 2021-05-24 ENCOUNTER — Encounter: Payer: Self-pay | Admitting: Hematology

## 2021-05-24 ENCOUNTER — Other Ambulatory Visit: Payer: Self-pay

## 2021-05-24 ENCOUNTER — Telehealth: Payer: Self-pay | Admitting: *Deleted

## 2021-05-24 ENCOUNTER — Inpatient Hospital Stay: Payer: Medicare Other | Attending: Hematology

## 2021-05-24 VITALS — BP 137/67 | HR 71 | Temp 98.2°F | Resp 18

## 2021-05-24 VITALS — BP 149/67 | HR 88 | Temp 98.7°F | Resp 19 | Ht 65.0 in | Wt 116.9 lb

## 2021-05-24 DIAGNOSIS — C7931 Secondary malignant neoplasm of brain: Secondary | ICD-10-CM

## 2021-05-24 DIAGNOSIS — C3492 Malignant neoplasm of unspecified part of left bronchus or lung: Secondary | ICD-10-CM

## 2021-05-24 DIAGNOSIS — C3412 Malignant neoplasm of upper lobe, left bronchus or lung: Secondary | ICD-10-CM | POA: Insufficient documentation

## 2021-05-24 DIAGNOSIS — Z5111 Encounter for antineoplastic chemotherapy: Secondary | ICD-10-CM | POA: Insufficient documentation

## 2021-05-24 DIAGNOSIS — Z95828 Presence of other vascular implants and grafts: Secondary | ICD-10-CM

## 2021-05-24 DIAGNOSIS — Z79899 Other long term (current) drug therapy: Secondary | ICD-10-CM | POA: Insufficient documentation

## 2021-05-24 LAB — CBC WITH DIFFERENTIAL (CANCER CENTER ONLY)
Abs Immature Granulocytes: 0.16 10*3/uL — ABNORMAL HIGH (ref 0.00–0.07)
Basophils Absolute: 0 10*3/uL (ref 0.0–0.1)
Basophils Relative: 0 %
Eosinophils Absolute: 0 10*3/uL (ref 0.0–0.5)
Eosinophils Relative: 0 %
HCT: 41.5 % (ref 36.0–46.0)
Hemoglobin: 14.3 g/dL (ref 12.0–15.0)
Immature Granulocytes: 1 %
Lymphocytes Relative: 3 %
Lymphs Abs: 0.5 10*3/uL — ABNORMAL LOW (ref 0.7–4.0)
MCH: 29.7 pg (ref 26.0–34.0)
MCHC: 34.5 g/dL (ref 30.0–36.0)
MCV: 86.3 fL (ref 80.0–100.0)
Monocytes Absolute: 0.7 10*3/uL (ref 0.1–1.0)
Monocytes Relative: 5 %
Neutro Abs: 14 10*3/uL — ABNORMAL HIGH (ref 1.7–7.7)
Neutrophils Relative %: 91 %
Platelet Count: 140 10*3/uL — ABNORMAL LOW (ref 150–400)
RBC: 4.81 MIL/uL (ref 3.87–5.11)
RDW: 14.9 % (ref 11.5–15.5)
WBC Count: 15.3 10*3/uL — ABNORMAL HIGH (ref 4.0–10.5)
nRBC: 0 % (ref 0.0–0.2)

## 2021-05-24 LAB — CMP (CANCER CENTER ONLY)
ALT: 37 U/L (ref 0–44)
AST: 12 U/L — ABNORMAL LOW (ref 15–41)
Albumin: 3.6 g/dL (ref 3.5–5.0)
Alkaline Phosphatase: 73 U/L (ref 38–126)
Anion gap: 8 (ref 5–15)
BUN: 18 mg/dL (ref 8–23)
CO2: 28 mmol/L (ref 22–32)
Calcium: 8.4 mg/dL — ABNORMAL LOW (ref 8.9–10.3)
Chloride: 99 mmol/L (ref 98–111)
Creatinine: 0.6 mg/dL (ref 0.44–1.00)
GFR, Estimated: >60 mL/min (ref >60)
Glucose, Bld: 164 mg/dL — ABNORMAL HIGH (ref 70–99)
Potassium: 4.2 mmol/L (ref 3.5–5.1)
Sodium: 135 mmol/L (ref 135–145)
Total Bilirubin: 0.4 mg/dL (ref 0.3–1.2)
Total Protein: 5.8 g/dL — ABNORMAL LOW (ref 6.5–8.1)

## 2021-05-24 LAB — TSH: TSH: 0.711 u[IU]/mL (ref 0.308–3.960)

## 2021-05-24 MED ORDER — SODIUM CHLORIDE 0.9 % IV SOLN
175.0000 mg/m2 | Freq: Once | INTRAVENOUS | Status: AC
  Administered 2021-05-24: 276 mg via INTRAVENOUS
  Filled 2021-05-24: qty 46

## 2021-05-24 MED ORDER — SODIUM CHLORIDE 0.9 % IV SOLN
150.0000 mg | Freq: Once | INTRAVENOUS | Status: AC
  Administered 2021-05-24: 150 mg via INTRAVENOUS
  Filled 2021-05-24: qty 150

## 2021-05-24 MED ORDER — SODIUM CHLORIDE 0.9% FLUSH
10.0000 mL | Freq: Once | INTRAVENOUS | Status: AC
  Administered 2021-05-24: 10 mL via INTRAVENOUS

## 2021-05-24 MED ORDER — FAMOTIDINE IN NACL 20-0.9 MG/50ML-% IV SOLN
20.0000 mg | Freq: Once | INTRAVENOUS | Status: AC
  Administered 2021-05-24: 20 mg via INTRAVENOUS
  Filled 2021-05-24: qty 50

## 2021-05-24 MED ORDER — HEPARIN SOD (PORK) LOCK FLUSH 100 UNIT/ML IV SOLN
500.0000 [IU] | Freq: Once | INTRAVENOUS | Status: AC | PRN
  Administered 2021-05-24: 500 [IU]

## 2021-05-24 MED ORDER — PALONOSETRON HCL INJECTION 0.25 MG/5ML
0.2500 mg | Freq: Once | INTRAVENOUS | Status: AC
  Administered 2021-05-24: 0.25 mg via INTRAVENOUS
  Filled 2021-05-24: qty 5

## 2021-05-24 MED ORDER — SODIUM CHLORIDE 0.9 % IV SOLN
200.0000 mg | Freq: Once | INTRAVENOUS | Status: AC
  Administered 2021-05-24: 200 mg via INTRAVENOUS
  Filled 2021-05-24: qty 200

## 2021-05-24 MED ORDER — SODIUM CHLORIDE 0.9% FLUSH
10.0000 mL | INTRAVENOUS | Status: DC | PRN
  Administered 2021-05-24: 10 mL

## 2021-05-24 MED ORDER — SODIUM CHLORIDE 0.9 % IV SOLN: Freq: Once | INTRAVENOUS | Status: AC

## 2021-05-24 MED ORDER — SODIUM CHLORIDE 0.9 % IV SOLN
10.0000 mg | Freq: Once | INTRAVENOUS | Status: AC
  Administered 2021-05-24: 10 mg via INTRAVENOUS
  Filled 2021-05-24: qty 10

## 2021-05-24 MED ORDER — DIPHENHYDRAMINE HCL 50 MG/ML IJ SOLN
25.0000 mg | Freq: Once | INTRAMUSCULAR | Status: AC
  Administered 2021-05-24: 25 mg via INTRAVENOUS
  Filled 2021-05-24: qty 1

## 2021-05-24 MED ORDER — SODIUM CHLORIDE 0.9 % IV SOLN
307.8000 mg | Freq: Once | INTRAVENOUS | Status: AC
  Administered 2021-05-24: 310 mg via INTRAVENOUS
  Filled 2021-05-24: qty 31

## 2021-05-24 NOTE — Progress Notes (Signed)
[place]   Telephone:([phone]) [phone] [phone]) [phone]   Clinic Follow up Note   Patient Care Team: Nolene Ebbs, MD as PCP - General (Internal Medicine) Valrie Hart, RN as Oncology Nurse Navigator (Oncology) Truitt Merle, MD as Consulting Physician (Hematology) Tyler Pita, MD as Consulting Physician (Radiation Oncology) Garner Nash, DO as Consulting Physician (Pulmonary Disease)  Date of Service:  05/24/2021  CHIEF COMPLAINT: f/u of metastatic lung cancer  CURRENT THERAPY:  First-line carboplatin/paclitaxel with Ballard Russell, starting 05/24/21  ASSESSMENT & PLAN:  Cynthia Gonzalez is a 71 y.o. female with   1. Squamous Cell LUL Lung Cancer, with nodal and brain metastasis -presented to ED 04/26/21 with right hand and wrist swelling and weakness. She was also found to have left-sided facial dropping on physical exam. Head CT revealed a 1.7 cm left frontal lobe mass and a right parapharyngeal space mass. CT CAP showed a LUL mass, right hilar mass, mediastinal and hilar adenopathy, as well as nonspecific left adrenal and gastric cardia wall thickening.  -inpatient bronchoscopy 04/28/21 by Dr. Valeta Harms and biopsy of LUL lung mass and 11R node both confirmed non-small cell carcinoma, IHC consistent with squamous cell histology. Will obtain PD-L1 expression on her biopsy  -she is scheduled to begin first-line chemotherapy, carboplatin and paclitaxel with Keytruda every 3 weeks, today. I reviewed potential AE and management with her today -lab reviewed, adequate for treatment. will slightly reduce carbo dose for cycle 1    2. Oligo brian metastasis  -she received one fraction of SRS on 05/08/21 under Dr. Tammi Klippel. -Dexamethasone tapering per Dr. Tammi Klippel -We will monitor her brain MRI every 3 months     PLAN: -proceed with C1 carboplatin, paclitaxel and Keytruda today, will slightly reduce carbo dose for cycle 1 -schedule GCS-F on day 3 -phone visit in one week  for toxicity check. -lab, flush, f/u, and C2 in 3 weeks   No problem-specific Assessment & Plan notes found for this encounter.   SUMMARY OF ONCOLOGIC HISTORY: Oncology History Overview Note   Cancer Staging  Squamous cell lung cancer, left [facility]) Staging form: Lung, AJCC 8th Edition - Clinical stage from 04/28/2021: Stage IVB (cT2, cN3, cM1c) - Signed by Truitt Merle, MD on 05/06/2021     Brain metastasis ([place])  04/26/2021 Imaging   EXAM: CT HEAD WITHOUT CONTRAST  IMPRESSION: 1. Approximally 1.7 cm left frontal lobe mass with moderate surrounding vasogenic edema and minimal mass effect. May represent metastatic or primary lesion. MRI brain with contrast recommended as well as oncologic workup as indicated. 2. Partially visualized hypodense mass in the right neck parapharyngeal space. Indeterminate but could also represent metastasis given the brain findings. Could also be evaluated further with CT neck with contrast.   04/27/2021 Imaging   EXAM: MRI HEAD WITHOUT AND WITH CONTRAST  IMPRESSION: 1. 1.9 x 1.5 x 1.9 cm intraparenchymal mass involving the posterior left frontoparietal region with surrounding vasogenic edema. 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 2. No other acute intracranial process. 3. 2.1 x 2.3 cm heterogeneous mass within the right parapharyngeal space, better evaluated on prior neck CT.   04/27/2021 Initial Diagnosis   Brain metastasis (H[facility])   Squamous cell lung cancer, left ([place])  04/26/2021 Imaging   EXAM: CT NECK WITH CONTRAST  IMPRESSION: 1. Heterogeneously enhancing, centrally hypodense mass in the right parapharyngeal space, favored to represent an enlarged, necrotic right level 2A lymph node, concerning for metastatic disease. Biopsy  is recommended. 2. No other acute finding in the neck.   04/26/2021 Imaging   EXAM: CT CHEST, ABDOMEN, AND PELVIS WITH CONTRAST  IMPRESSION: 1. Large spiculated  left upper lobe mass consistent with primary lung cancer. 2. Right hilar mass versus adenopathy, with partial obstruction of segmental right upper lobe bronchi. 3. Mediastinal and hilar adenopathy consistent with nodal metastases. 4. Nonspecific left adrenal thickening, metastatic disease not excluded. 5. Nonspecific wall thickening of the gastric cardia. 6. 3.7 cm infrarenal abdominal aortic aneurysm. Recommend follow-up every 2 years. Reference: J Am Coll Radiol 8811;03:159-458. 7. 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 The bilateral profundus femoral arteries remain patent. 8. Cholelithiasis without cholecystitis. 9.  Aortic Atherosclerosis (ICD10-I70.0).   04/28/2021 Pathology Results   FINAL MICROSCOPIC DIAGNOSIS:  A. LUNG, LUL, BRUSHING:  - Malignant cells consistent with non-small cell carcinoma   B. LUNG, LUL, FINE NEEDLE ASPIRATION:  - Malignant cells consistent with non-small cell carcinoma   C. LYMPH NODE, 11R, FINE NEEDLE ASPIRATION:  - Malignant cells consistent with non-small cell carcinoma   ADDENDUM:  The malignant cells are positive with cytokeratin 5/6 and p63 and negative with TTF-1 consistent with squamous cell carcinoma.    04/28/2021 Cancer Staging   Staging form: Lung, AJCC 8th Edition - Clinical stage from 04/28/2021: Stage IVB (cT2, cN3, cM1c) - Signed by Truitt Merle, MD on 05/06/2021    05/05/2021 Initial Diagnosis   Squamous cell lung cancer, left ([place])   05/18/2021 PET scan   IMPRESSION: 1. Hypermetabolic rounded mass in the LEFT upper lobe most consistent with bronchogenic carcinoma. 2. Contralateral hypermetabolic paratracheal lymph node and RIGHT hilar lymph node metastasis. 3. No evidence of metastatic disease outside the thorax.   05/24/2021 -  Chemotherapy   Patient is on Treatment Plan : LUNG NSCLC Carboplatin (6) + Paclitaxel (200) + Pembrolizumab (200) D1 q21d x 4 cycles / Pembrolizumab (200) Maintenance D1 q21d         INTERVAL HISTORY:  Cynthia Gonzalez is here for a follow up of metastatic lung cancer. She was last seen by me on 05/05/21. She presents to the clinic alone. She notes her daughter dropped her off and will pick her up. She reports she is tired today; she attributes it to not sleeping well last night because of the rain. She notes she is able to do all her usual activities.   All other systems were reviewed with the patient and are negative.  MEDICAL HISTORY:  Past Medical History:  Diagnosis Date   Allergy    Blood transfusion without reported diagnosis 1976   Hyperlipidemia     SURGICAL HISTORY: Past Surgical History:  Procedure Laterality Date   BRONCHIAL BIOPSY  04/28/2021   Procedure: BRONCHIAL BIOPSIES;  Surgeon: Garner Nash, DO;  Location: [facility];  Service: Pulmonary;;   BRONCHIAL BRUSHINGS  04/28/2021   Procedure: BRONCHIAL BRUSHINGS;  Surgeon: Garner Nash, DO;  Location: [place] [facility];  Service: Pulmonary;;   BRONCHIAL NEEDLE ASPIRATION BIOPSY  04/28/2021   Procedure: BRONCHIAL NEEDLE ASPIRATION BIOPSIES;  Surgeon: Garner Nash, DO;  Location: [place];  Service: Pulmonary;;   DENTAL SURGERY     with sedative per pt, not sedated   IR IMAGING GUIDED PORT INSERTION  05/22/2021   TUBAL LIGATION  1983   VAGINAL DELIVERY     x7-sedation with 1 delivery [place] N/A 04/28/2021   Procedure: VIDEO BRONCHOSCOPY WITH ENDOBRONCHIAL ULTRASOUND;  Surgeon: June Leap  L, DO;  Location: [facility];  Service: Pulmonary;  Laterality: N/A;   VIDEO BRONCHOSCOPY WITH RADIAL ENDOBRONCHIAL ULTRASOUND  04/28/2021   Procedure: VIDEO BRONCHOSCOPY WITH RADIAL ENDOBRONCHIAL ULTRASOUND;  Surgeon: Garner Nash, DO;  Location: [facility];  Service: Pulmonary;;    I have reviewed the social history and family history with the patient and they are unchanged from previous note.  ALLERGIES:  has No Known  Allergies.  MEDICATIONS:  Current Outpatient Medications  Medication Sig Dispense Refill   atorvastatin (LIPITOR) 40 MG tablet Take 1 tablet (40 mg total) by mouth daily. 30 tablet 2   dexamethasone (DECADRON) 4 MG tablet Take 1 tablet (4 mg total) by mouth 3 (three) times daily for 7 days, THEN 1 tablet (4 mg total) 2 (two) times daily for 7 days, THEN 0.5 tablets (2 mg total) 2 (two) times daily for 7 days, THEN 0.5 tablets (2 mg total) daily for 7 days. 45.5 tablet 0   guaiFENesin-dextromethorphan (ROBITUSSIN DM) 100-10 MG/5ML syrup Take 10 mLs by mouth every 4 (four) hours as needed for cough. 118 mL 0   lidocaine-prilocaine (EMLA) cream Apply to affected area once 30 g 3   nystatin (MYCOSTATIN) 100000 UNIT/ML suspension Take 5 mLs (500,000 Units total) by mouth 4 (four) times daily. 473 mL 0   ondansetron (ZOFRAN) 8 MG tablet Take 1 tablet (8 mg total) by mouth 2 (two) times daily as needed for refractory nausea / vomiting. Start on day 3 after carboplatin chemo. 30 tablet 1   pantoprazole (PROTONIX) 40 MG tablet Take 1 tablet (40 mg total) by mouth daily. 30 tablet 0   prochlorperazine (COMPAZINE) 10 MG tablet Take 1 tablet (10 mg total) by mouth every 6 (six) hours as needed (Nausea or vomiting). 30 tablet 1   No current facility-administered medications for this visit.   Facility-Administered Medications Ordered in Other Visits  Medication Dose Route Frequency Provider Last Rate Last Admin   0.9 %  sodium chloride infusion   Intravenous Once Truitt Merle, MD       dexamethasone (DECADRON) 10 mg in sodium chloride 0.9 % 50 mL IVPB  10 mg Intravenous Once Truitt Merle, MD       diphenhydrAMINE (BENADRYL) injection 25 mg  25 mg Intravenous Once Truitt Merle, MD       famotidine (PEPCID) IVPB 20 mg premix  20 mg Intravenous Once Truitt Merle, MD       fosaprepitant (EMEND) 150 mg in sodium chloride 0.9 % 145 mL IVPB  150 mg Intravenous Once Truitt Merle, MD       heparin lock flush 100 unit/mL  500 Units  Intracatheter Once PRN Truitt Merle, MD       PACLitaxel (TAXOL) 276 mg in sodium chloride 0.9 % 250 mL chemo infusion (> 41m/m2)  175 mg/m2 (Treatment Plan Recorded) Intravenous Once FTruitt Merle MD       palonosetron (ALOXI) injection 0.25 mg  0.25 mg Intravenous Once FTruitt Merle MD       pembrolizumab ([facility] 200 mg in sodium chloride 0.9 % 50 mL chemo infusion  200 mg Intravenous Once FTruitt Merle MD       sodium chloride flush (NS) 0.9 % injection 10 mL  10 mL Intracatheter PRN FTruitt Merle MD        PHYSICAL EXAMINATION: ECOG PERFORMANCE STATUS: 2 - Symptomatic, <50% confined to bed  Vitals:   05/24/21 0859  BP: (!) 149/67  Pulse: 88  Resp: 19  Temp: 98.7 F (  37.1 C)  SpO2: 96%   Wt Readings from Last 3 Encounters:  05/24/21 116 lb 14.4 oz (53 kg)  05/19/21 115 lb 3.2 oz (52.3 kg)  05/05/21 121 lb (54.9 kg)     GENERAL:alert, no distress and comfortable SKIN: skin color normal, no rashes or significant lesions EYES: normal, Conjunctiva are pink and non-injected, sclera clear  NEURO: alert & oriented x 3 with fluent speech  LABORATORY DATA:  I have reviewed the data as listed CBC Latest Ref Rng & Units 05/24/2021 05/05/2021 04/28/2021  WBC 4.0 - 10.5 K/uL 15.3(H) 19.5(H) 18.2(H)  Hemoglobin 12.0 - 15.0 g/dL 14.3 14.6 12.3  Hematocrit 36.0 - 46.0 % 41.5 43.2 37.0  Platelets 150 - 400 K/uL 140(L) 398 446(H)     CMP Latest Ref Rng & Units 05/24/2021 05/05/2021 04/28/2021  Glucose 70 - 99 mg/dL 164(H) 125(H) -  BUN 8 - 23 mg/dL 18 19 -  Creatinine 0.44 - 1.00 mg/dL 0.60 0.80 -  Sodium 135 - 145 mmol/L 135 136 -  Potassium 3.5 - 5.1 mmol/L 4.2 4.0 -  Chloride 98 - 111 mmol/L 99 102 -  CO2 22 - 32 mmol/L 28 23 -  Calcium 8.9 - 10.3 mg/dL 8.4(L) 8.3(L) -  Total Protein 6.5 - 8.1 g/dL 5.8(L) 6.3(L) 6.4(L)  Total Bilirubin 0.3 - 1.2 mg/dL 0.4 0.3 0.2(L)  Alkaline Phos 38 - 126 U/L 73 80 91  AST 15 - 41 U/L 12(L) 13(L) 15  ALT 0 - 44 U/L 37 16 10      RADIOGRAPHIC STUDIES: I  have personally reviewed the radiological images as listed and agreed with the findings in the report. IR IMAGING GUIDED PORT INSERTION  Result Date: 05/22/2021 INDICATION: 71 year old female with recently diagnosed left upper lobe non-small cell lung cancer metastatic to the brain. She presents for port catheter placement to facilitate chemotherapy. EXAM: IMPLANTED PORT A CATH PLACEMENT WITH ULTRASOUND AND FLUOROSCOPIC GUIDANCE MEDICATIONS: None. ANESTHESIA/SEDATION: Versed 1.5 mg IV; Fentanyl 50 mcg IV; Moderate Sedation Time:  17 minutes The patient was continuously monitored during the procedure by the interventional radiology nurse under my direct supervision. FLUOROSCOPY TIME:  0 minutes, 36 seconds (1 mGy) COMPLICATIONS: None immediate. PROCEDURE: The right neck and chest was prepped with chlorhexidine, and draped in the usual sterile fashion using maximum barrier technique (cap and mask, sterile gown, sterile gloves, large sterile sheet, hand hygiene and cutaneous antiseptic). Local anesthesia was attained by infiltration with 1% lidocaine with epinephrine. Ultrasound demonstrated patency of the right internal jugular vein, and this was documented with an image. Under real-time ultrasound guidance, this vein was accessed with a 21 gauge micropuncture needle and image documentation was performed. A small dermatotomy was made at the access site with an 11 scalpel. A 0.018" wire was advanced into the SVC and the access needle exchanged for a 21F micropuncture vascular sheath. The 0.018" wire was then removed and a 0.035" wire advanced into the IVC. An appropriate location for the subcutaneous reservoir was selected below the clavicle and an incision was made through the skin and underlying soft tissues. The subcutaneous tissues were then dissected using a combination of blunt and sharp surgical technique and a pocket was formed. A single lumen power injectable portacatheter was then tunneled through the  subcutaneous tissues from the pocket to the dermatotomy and the port reservoir placed within the subcutaneous pocket. The venous access site was then serially dilated and a peel away vascular sheath placed over the wire. The wire was  removed and the port catheter advanced into position under fluoroscopic guidance. The catheter tip is positioned in the superior cavoatrial junction. This was documented with a spot image. The portacatheter was then tested and found to flush and aspirate well. The port was flushed with saline followed by 100 units/mL heparinized saline. The pocket was then closed in two layers using first subdermal inverted interrupted absorbable sutures followed by a running subcuticular suture. The epidermis was then sealed with Dermabond. The dermatotomy at the venous access site was also closed with Dermabond. IMPRESSION: Successful placement of a right IJ approach Power Port with ultrasound and fluoroscopic guidance. The catheter is ready for use. Electronically Signed   By: Jacqulynn Cadet M.D.   On: 05/22/2021 14:27      No orders of the defined types were placed in this encounter.  All questions were answered. The patient knows to call the clinic with any problems, questions or concerns. No barriers to learning was detected. The total time spent in the appointment was 30 minutes.     Truitt Merle, MD 05/24/2021   I, Wilburn Mylar, am acting as scribe for Truitt Merle, MD.   I have reviewed the above documentation for accuracy and completeness, and I agree with the above.

## 2021-05-24 NOTE — Patient Instructions (Signed)
Pembrolizumab Cynthia Gonzalez), [facility]  Discharge Instructions: Thank you for choosing [facility] to provide your oncology and hematology care.   If you have a lab appointment with the [facility], please go directly to the [facility] and check in at the registration area.   Wear comfortable clothing and clothing appropriate for easy access to any Portacath or PICC line.   We strive to give you quality time with your provider. You may need to reschedule your appointment if you arrive late (15 or more minutes).  Arriving late affects you and other patients whose appointments are after yours.  Also, if you miss three or more appointments without notifying the office, you may be dismissed from the clinic at the providers discretion.      For prescription refill requests, have your pharmacy contact our office and allow 72 hours for refills to be completed.    Today you received the following chemotherapy and/or immunotherapy agents Pembrolizumab (Keytruda), Paclitaxel (Taxol), Carboplatin      To help prevent nausea and vomiting after your treatment, we encourage you to take your nausea medication as directed.  BELOW ARE SYMPTOMS THAT SHOULD BE REPORTED IMMEDIATELY: *FEVER GREATER THAN 100.4 F (38 C) OR HIGHER *CHILLS OR SWEATING *NAUSEA AND VOMITING THAT IS NOT CONTROLLED WITH YOUR NAUSEA MEDICATION *UNUSUAL SHORTNESS OF BREATH *UNUSUAL BRUISING OR BLEEDING *URINARY PROBLEMS (pain or burning when urinating, or frequent urination) *BOWEL PROBLEMS (unusual diarrhea, constipation, pain near the anus) TENDERNESS IN MOUTH AND THROAT WITH OR WITHOUT PRESENCE OF ULCERS (sore throat, sores in mouth, or a toothache) UNUSUAL RASH, SWELLING OR PAIN  UNUSUAL VAGINAL DISCHARGE OR ITCHING   Items with * indicate a potential emergency and should be followed up as soon as possible or go to the Emergency Department if any problems should occur.  Please  show the CHEMOTHERAPY ALERT CARD or IMMUNOTHERAPY ALERT CARD at check-in to the Emergency Department and triage nurse.  Should you have questions after your visit or need to cancel or reschedule your appointment, please contact [facility]  Dept: [phone]  and follow the prompts.  Office hours are 8:00 a.m. to 4:30 p.m. Monday - Friday. Please note that voicemails left after 4:00 p.m. may not be returned until the following business day.  We are closed weekends and major holidays. You have access to a nurse at all times for urgent questions. Please call the main number to the clinic Dept: [phone] and follow the prompts.   For any non-urgent questions, you may also contact your provider using MyChart. We now offer e-Visits for anyone 4 and older to request care online for non-urgent symptoms. For details visit [email or web address].   Also download the MyChart app! Go to the app store, search "MyChart", open the app, select [facility], and log in with your MyChart username and password.  Due to Covid, a mask is required upon entering the hospital/clinic. If you do not have a mask, one will be given to you upon arrival. For doctor visits, patients may have 1 support person aged 59 or older with them. For treatment visits, patients cannot have anyone with them due to current Covid guidelines and our immunocompromised population.   Pembrolizumab (Keytruda) injection What is this medication? PEMBROLIZUMAB (pem broe liz ue mab) is a monoclonal antibody. It is used to treat certain types of cancer. This medicine may be used for other purposes; ask your health care provider or pharmacist if you  have questions. COMMON BRAND NAME(S): Keytruda What should I tell my care team before I take this medication? They need to know if you have any of these conditions: autoimmune diseases like Crohn's disease, ulcerative colitis, or lupus have had or planning to have an  allogeneic stem cell transplant (uses someone else's stem cells) history of organ transplant history of chest radiation nervous system problems like myasthenia gravis or Guillain-Barre syndrome an unusual or allergic reaction to pembrolizumab, other medicines, foods, dyes, or preservatives pregnant or trying to get pregnant breast-feeding How should I use this medication? This medicine is for infusion into a vein. It is given by a health care professional in a hospital or clinic setting. A special MedGuide will be given to you before each treatment. Be sure to read this information carefully each time. Talk to your pediatrician regarding the use of this medicine in children. While this drug may be prescribed for children as young as 6 months for selected conditions, precautions do apply. Overdosage: If you think you have taken too much of this medicine contact a poison control center or emergency room at once. NOTE: This medicine is only for you. Do not share this medicine with others. What if I miss a dose? It is important not to miss your dose. Call your doctor or health care professional if you are unable to keep an appointment. What may interact with this medication? Interactions have not been studied. This list may not describe all possible interactions. Give your health care provider a list of all the medicines, herbs, non-prescription drugs, or dietary supplements you use. Also tell them if you smoke, drink alcohol, or use illegal drugs. Some items may interact with your medicine. What should I watch for while using this medication? Your condition will be monitored carefully while you are receiving this medicine. You may need blood work done while you are taking this medicine. Do not become pregnant while taking this medicine or for 4 months after stopping it. Women should inform their doctor if they wish to become pregnant or think they might be pregnant. There is a potential for serious  side effects to an unborn child. Talk to your health care professional or pharmacist for more information. Do not breast-feed an infant while taking this medicine or for 4 months after the last dose. What side effects may I notice from receiving this medication? Side effects that you should report to your doctor or health care professional as soon as possible: allergic reactions like skin rash, itching or hives, swelling of the face, lips, or tongue bloody or black, tarry breathing problems changes in vision chest pain chills confusion constipation cough diarrhea dizziness or feeling faint or lightheaded fast or irregular heartbeat fever flushing joint pain low blood counts - this medicine may decrease the number of white blood cells, red blood cells and platelets. You may be at increased risk for infections and bleeding. muscle pain muscle weakness pain, tingling, numbness in the hands or feet persistent headache redness, blistering, peeling or loosening of the skin, including inside the mouth signs and symptoms of high blood sugar such as dizziness; dry mouth; dry skin; fruity breath; nausea; stomach pain; increased hunger or thirst; increased urination signs and symptoms of kidney injury like trouble passing urine or change in the amount of urine signs and symptoms of liver injury like dark urine, light-colored stools, loss of appetite, nausea, right upper belly pain, yellowing of the eyes or skin sweating swollen lymph nodes weight loss Side  effects that usually do not require medical attention (report to your doctor or health care professional if they continue or are bothersome): decreased appetite hair loss tiredness This list may not describe all possible side effects. Call your doctor for medical advice about side effects. You may report side effects to FDA at 1-800-FDA-1088. Where should I keep my medication? This drug is given in a hospital or clinic and will not be  stored at home. NOTE: This sheet is a summary. It may not cover all possible information. If you have questions about this medicine, talk to your doctor, pharmacist, or health care provider.  2022 Elsevier/Gold Standard (2020-12-27 00:00:00)  Paclitaxel injection What is this medication? PACLITAXEL (PAK li TAX el) is a chemotherapy drug. It targets fast dividing cells, like cancer cells, and causes these cells to die. This medicine is used to treat ovarian cancer, breast cancer, lung cancer, Kaposi's sarcoma, and other cancers. This medicine may be used for other purposes; ask your health care provider or pharmacist if you have questions. COMMON BRAND NAME(S): Onxol, Taxol What should I tell my care team before I take this medication? They need to know if you have any of these conditions: history of irregular heartbeat liver disease low blood counts, like low white cell, platelet, or red cell counts lung or breathing disease, like asthma tingling of the fingers or toes, or other nerve disorder an unusual or allergic reaction to paclitaxel, alcohol, polyoxyethylated castor oil, other chemotherapy, other medicines, foods, dyes, or preservatives pregnant or trying to get pregnant breast-feeding How should I use this medication? This drug is given as an infusion into a vein. It is administered in a hospital or clinic by a specially trained health care professional. Talk to your pediatrician regarding the use of this medicine in children. Special care may be needed. Overdosage: If you think you have taken too much of this medicine contact a poison control center or emergency room at once. NOTE: This medicine is only for you. Do not share this medicine with others. What if I miss a dose? It is important not to miss your dose. Call your doctor or health care professional if you are unable to keep an appointment. What may interact with this medication? Do not take this medicine with any of the  following medications: live virus vaccines This medicine may also interact with the following medications: antiviral medicines for hepatitis, HIV or AIDS certain antibiotics like erythromycin and clarithromycin certain medicines for fungal infections like ketoconazole and itraconazole certain medicines for seizures like carbamazepine, phenobarbital, phenytoin gemfibrozil nefazodone rifampin St. John's wort This list may not describe all possible interactions. Give your health care provider a list of all the medicines, herbs, non-prescription drugs, or dietary supplements you use. Also tell them if you smoke, drink alcohol, or use illegal drugs. Some items may interact with your medicine. What should I watch for while using this medication? Your condition will be monitored carefully while you are receiving this medicine. You will need important blood work done while you are taking this medicine. This medicine can cause serious allergic reactions. To reduce your risk you will need to take other medicine(s) before treatment with this medicine. If you experience allergic reactions like skin rash, itching or hives, swelling of the face, lips, or tongue, tell your doctor or health care professional right away. In some cases, you may be given additional medicines to help with side effects. Follow all directions for their use. This drug may make you  feel generally unwell. This is not uncommon, as chemotherapy can affect healthy cells as well as cancer cells. Report any side effects. Continue your course of treatment even though you feel ill unless your doctor tells you to stop. Call your doctor or health care professional for advice if you get a fever, chills or sore throat, or other symptoms of a cold or flu. Do not treat yourself. This drug decreases your body's ability to fight infections. Try to avoid being around people who are sick. This medicine may increase your risk to bruise or bleed. Call your  doctor or health care professional if you notice any unusual bleeding. Be careful brushing and flossing your teeth or using a toothpick because you may get an infection or bleed more easily. If you have any dental work done, tell your dentist you are receiving this medicine. Avoid taking products that contain aspirin, acetaminophen, ibuprofen, naproxen, or ketoprofen unless instructed by your doctor. These medicines may hide a fever. Do not become pregnant while taking this medicine. Women should inform their doctor if they wish to become pregnant or think they might be pregnant. There is a potential for serious side effects to an unborn child. Talk to your health care professional or pharmacist for more information. Do not breast-feed an infant while taking this medicine. Men are advised not to father a child while receiving this medicine. This product may contain alcohol. Ask your pharmacist or healthcare provider if this medicine contains alcohol. Be sure to tell all healthcare providers you are taking this medicine. Certain medicines, like metronidazole and disulfiram, can cause an unpleasant reaction when taken with alcohol. The reaction includes flushing, headache, nausea, vomiting, sweating, and increased thirst. The reaction can last from 30 minutes to several hours. What side effects may I notice from receiving this medication? Side effects that you should report to your doctor or health care professional as soon as possible: allergic reactions like skin rash, itching or hives, swelling of the face, lips, or tongue breathing problems changes in vision fast, irregular heartbeat high or low blood pressure mouth sores pain, tingling, numbness in the hands or feet signs of decreased platelets or bleeding - bruising, pinpoint red spots on the skin, black, tarry stools, blood in the urine signs of decreased red blood cells - unusually weak or tired, feeling faint or lightheaded, falls signs of  infection - fever or chills, cough, sore throat, pain or difficulty passing urine signs and symptoms of liver injury like dark yellow or brown urine; general ill feeling or flu-like symptoms; light-colored stools; loss of appetite; nausea; right upper belly pain; unusually weak or tired; yellowing of the eyes or skin swelling of the ankles, feet, hands unusually slow heartbeat Side effects that usually do not require medical attention (report to your doctor or health care professional if they continue or are bothersome): diarrhea hair loss loss of appetite muscle or joint pain nausea, vomiting pain, redness, or irritation at site where injected tiredness This list may not describe all possible side effects. Call your doctor for medical advice about side effects. You may report side effects to FDA at 1-800-FDA-1088. Where should I keep my medication? This drug is given in a hospital or clinic and will not be stored at home. NOTE: This sheet is a summary. It may not cover all possible information. If you have questions about this medicine, talk to your doctor, pharmacist, or health care provider.  2022 Elsevier/Gold Standard (2020-12-27 00:00:00)  Carboplatin injection What is  this medication? CARBOPLATIN (KAR boe pla tin) is a chemotherapy drug. It targets fast dividing cells, like cancer cells, and causes these cells to die. This medicine is used to treat ovarian cancer and many other cancers. This medicine may be used for other purposes; ask your health care provider or pharmacist if you have questions. COMMON BRAND NAME(S): Paraplatin What should I tell my care team before I take this medication? They need to know if you have any of these conditions: blood disorders hearing problems kidney disease recent or ongoing radiation therapy an unusual or allergic reaction to carboplatin, cisplatin, other chemotherapy, other medicines, foods, dyes, or preservatives pregnant or trying to get  pregnant breast-feeding How should I use this medication? This drug is usually given as an infusion into a vein. It is administered in a hospital or clinic by a specially trained health care professional. Talk to your pediatrician regarding the use of this medicine in children. Special care may be needed. Overdosage: If you think you have taken too much of this medicine contact a poison control center or emergency room at once. NOTE: This medicine is only for you. Do not share this medicine with others. What if I miss a dose? It is important not to miss a dose. Call your doctor or health care professional if you are unable to keep an appointment. What may interact with this medication? medicines for seizures medicines to increase blood counts like filgrastim, pegfilgrastim, sargramostim some antibiotics like amikacin, gentamicin, neomycin, streptomycin, tobramycin vaccines Talk to your doctor or health care professional before taking any of these medicines: acetaminophen aspirin ibuprofen ketoprofen naproxen This list may not describe all possible interactions. Give your health care provider a list of all the medicines, herbs, non-prescription drugs, or dietary supplements you use. Also tell them if you smoke, drink alcohol, or use illegal drugs. Some items may interact with your medicine. What should I watch for while using this medication? Your condition will be monitored carefully while you are receiving this medicine. You will need important blood work done while you are taking this medicine. This drug may make you feel generally unwell. This is not uncommon, as chemotherapy can affect healthy cells as well as cancer cells. Report any side effects. Continue your course of treatment even though you feel ill unless your doctor tells you to stop. In some cases, you may be given additional medicines to help with side effects. Follow all directions for their use. Call your doctor or health  care professional for advice if you get a fever, chills or sore throat, or other symptoms of a cold or flu. Do not treat yourself. This drug decreases your body's ability to fight infections. Try to avoid being around people who are sick. This medicine may increase your risk to bruise or bleed. Call your doctor or health care professional if you notice any unusual bleeding. Be careful brushing and flossing your teeth or using a toothpick because you may get an infection or bleed more easily. If you have any dental work done, tell your dentist you are receiving this medicine. Avoid taking products that contain aspirin, acetaminophen, ibuprofen, naproxen, or ketoprofen unless instructed by your doctor. These medicines may hide a fever. Do not become pregnant while taking this medicine. Women should inform their doctor if they wish to become pregnant or think they might be pregnant. There is a potential for serious side effects to an unborn child. Talk to your health care professional or pharmacist for more information.  Do not breast-feed an infant while taking this medicine. What side effects may I notice from receiving this medication? Side effects that you should report to your doctor or health care professional as soon as possible: allergic reactions like skin rash, itching or hives, swelling of the face, lips, or tongue signs of infection - fever or chills, cough, sore throat, pain or difficulty passing urine signs of decreased platelets or bleeding - bruising, pinpoint red spots on the skin, black, tarry stools, nosebleeds signs of decreased red blood cells - unusually weak or tired, fainting spells, lightheadedness breathing problems changes in hearing changes in vision chest pain high blood pressure low blood counts - This drug may decrease the number of white blood cells, red blood cells and platelets. You may be at increased risk for infections and bleeding. nausea and vomiting pain,  swelling, redness or irritation at the injection site pain, tingling, numbness in the hands or feet problems with balance, talking, walking trouble passing urine or change in the amount of urine Side effects that usually do not require medical attention (report to your doctor or health care professional if they continue or are bothersome): hair loss loss of appetite metallic taste in the mouth or changes in taste This list may not describe all possible side effects. Call your doctor for medical advice about side effects. You may report side effects to FDA at 1-800-FDA-1088. Where should I keep my medication? This drug is given in a hospital or clinic and will not be stored at home. NOTE: This sheet is a summary. It may not cover all possible information. If you have questions about this medicine, talk to your doctor, pharmacist, or health care provider.  2022 Elsevier/Gold Standard (2007-09-17 00:00:00)

## 2021-05-25 ENCOUNTER — Telehealth: Payer: Self-pay | Admitting: Hematology

## 2021-05-25 ENCOUNTER — Telehealth: Payer: Self-pay

## 2021-05-25 LAB — T4: T4, Total: 4.2 ug/dL — ABNORMAL LOW (ref 4.5–12.0)

## 2021-05-25 NOTE — Telephone Encounter (Signed)
Scheduled follow-up appointments per 2/1 los. Patient's daughter is aware.

## 2021-05-25 NOTE — Telephone Encounter (Signed)
-----   Message from Ishmael Holter, RN sent at 05/24/2021  3:26 PM EST ----- Regarding: Dr. Burr Medico 1st tx f/u call Dr. Burr Medico 1st tx f/u call. Keytruda/ Taxol/ Carbo. Tolerated well

## 2021-05-25 NOTE — Telephone Encounter (Signed)
Spoke with daughter Melody. Cynthia Gonzalez is doing well. She is eating, drinking, and urinating well. No N/V. She is feeling tired which is to be expected.  Daughter knows to call the office at [phone] if she has any questions or concerns.

## 2021-05-27 ENCOUNTER — Inpatient Hospital Stay: Payer: Medicare Other

## 2021-05-27 ENCOUNTER — Other Ambulatory Visit: Payer: Self-pay

## 2021-05-27 VITALS — BP 137/83 | HR 99 | Temp 97.7°F | Resp 19 | Ht 65.0 in

## 2021-05-27 DIAGNOSIS — C3492 Malignant neoplasm of unspecified part of left bronchus or lung: Secondary | ICD-10-CM

## 2021-05-27 DIAGNOSIS — Z5111 Encounter for antineoplastic chemotherapy: Secondary | ICD-10-CM | POA: Diagnosis not present

## 2021-05-27 MED ORDER — PEGFILGRASTIM-BMEZ 6 MG/0.6ML ~~LOC~~ SOSY
6.0000 mg | PREFILLED_SYRINGE | Freq: Once | SUBCUTANEOUS | Status: AC
  Administered 2021-05-27: 6 mg via SUBCUTANEOUS
  Filled 2021-05-27: qty 0.6

## 2021-05-27 NOTE — Patient Instructions (Signed)

## 2021-05-29 ENCOUNTER — Other Ambulatory Visit: Payer: Self-pay

## 2021-05-29 ENCOUNTER — Telehealth: Payer: Self-pay

## 2021-05-29 NOTE — Telephone Encounter (Signed)
Pt called asking if the chemotherapy she's receiving causes her legs to "Not Work".  Pt is receiving pembrolizumab, Paclitaxel, and carboplatin.  Pt stated she has sensation in her hands and feet and is able to wiggle her toes with no complications.  Pt denied burning, numbness, and tingling in her feet & hands.  Pt stated her feet feel heavy and is currently having to lift her legs with her arms to move them.  Pt denied swelling in her legs & feet.  Informed pt that this RN will make Dr. Burr Medico aware of the heaviness in her legs.

## 2021-05-30 ENCOUNTER — Encounter: Payer: Self-pay | Admitting: Hematology

## 2021-05-30 ENCOUNTER — Telehealth: Payer: Self-pay

## 2021-05-30 ENCOUNTER — Other Ambulatory Visit: Payer: Self-pay | Admitting: Radiation Therapy

## 2021-05-30 ENCOUNTER — Inpatient Hospital Stay (HOSPITAL_BASED_OUTPATIENT_CLINIC_OR_DEPARTMENT_OTHER): Payer: Medicare Other | Admitting: Hematology

## 2021-05-30 DIAGNOSIS — C3492 Malignant neoplasm of unspecified part of left bronchus or lung: Secondary | ICD-10-CM

## 2021-05-30 NOTE — Progress Notes (Signed)
[place]   [phone] [phone]   Clinic Follow up Note   Patient Care Team: Nolene Ebbs, MD as PCP - General (Internal Medicine) Valrie Hart, RN as Oncology Nurse Navigator (Oncology) Truitt Merle, MD as Consulting Physician (Hematology) Tyler Pita, MD as Consulting Physician (Radiation Oncology) Garner Nash, DO as Consulting Physician (Pulmonary Disease)  Date of Service:  05/30/2021  CHIEF COMPLAINT: f/u of metastatic lung cancer  CURRENT THERAPY:  First-line carboplatin/paclitaxel with Ballard Russell, starting 05/24/21  ASSESSMENT & PLAN:  Cynthia Gonzalez is a 71 y.o. female with   1. Squamous Cell LUL Lung Cancer, with nodal and brain metastasis -presented to ED 04/26/21 with right hand and wrist swelling and weakness. She was also found to have left-sided facial dropping on physical exam. Head CT revealed a 1.7 cm left frontal lobe mass and a right parapharyngeal space mass. CT CAP showed a LUL mass, right hilar mass, mediastinal and hilar adenopathy, as well as nonspecific left adrenal and gastric cardia wall thickening.  -inpatient bronchoscopy 04/28/21 by Dr. Valeta Harms and biopsy of LUL lung mass and 11R node both confirmed non-small cell carcinoma, IHC consistent with squamous cell histology. Will obtain PD-L1 expression on her biopsy  -she started first-line chemotherapy, carboplatin and paclitaxel with Keytruda every 3 weeks,  on 05/24/2021.  She tolerated first cycle of chemo overall well.   2. Oligo brian metastasis, new onset left leg weakness  -she received one fraction of SRS on 05/08/21 under Dr. Tammi Klippel. -She has developed new onset left leg weakness, she was not able to come in to be evaluated today, I ordered a stat brain MRI to be done tomorrow -Dexamethasone tapering per Dr. Tammi Klippel, she was supposed to decrease to 2 mg twice daily, I told her to stay on 4 mg twice daily for now -We will call follow-up after MRI  results returns     PLAN: Stat brain MRI w wo contrast tomorrow, will call her when results returns   I informed Dr. Tammi Klippel also  F/u with second cycle chemo on 2/23    No problem-specific Assessment & Plan notes found for this encounter.   SUMMARY OF ONCOLOGIC HISTORY: Oncology History Overview Note   Cancer Staging  Squamous cell lung cancer, left [facility]) Staging form: Lung, AJCC 8th Edition - Clinical stage from 04/28/2021: Stage IVB (cT2, cN3, cM1c) - Signed by Truitt Merle, MD on 05/06/2021     Brain metastasis ([place])  04/26/2021 Imaging   EXAM: CT HEAD WITHOUT CONTRAST  IMPRESSION: 1. Approximally 1.7 cm left frontal lobe mass with moderate surrounding vasogenic edema and minimal mass effect. May represent metastatic or primary lesion. MRI brain with contrast recommended as well as oncologic workup as indicated. 2. Partially visualized hypodense mass in the right neck parapharyngeal space. Indeterminate but could also represent metastasis given the brain findings. Could also be evaluated further with CT neck with contrast.   04/27/2021 Imaging   EXAM: MRI HEAD WITHOUT AND WITH CONTRAST  IMPRESSION: 1. 1.9 x 1.5 x 1.9 cm intraparenchymal mass involving the posterior left frontoparietal region with surrounding vasogenic edema. 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 2. No other acute intracranial process. 3. 2.1 x 2.3 cm heterogeneous mass within the right parapharyngeal space, better evaluated on prior neck CT.   04/27/2021 Initial Diagnosis   Brain metastasis ([facility])   Squamous cell lung cancer, left ([place])  04/26/2021 Imaging   EXAM: CT  NECK WITH CONTRAST  IMPRESSION: 1. Heterogeneously enhancing, centrally hypodense mass in the right parapharyngeal space, favored to represent an enlarged, necrotic right level 2A lymph node, concerning for metastatic disease. Biopsy is recommended. 2. No other acute finding in the  neck.   04/26/2021 Imaging   EXAM: CT CHEST, ABDOMEN, AND PELVIS WITH CONTRAST  IMPRESSION: 1. Large spiculated left upper lobe mass consistent with primary lung cancer. 2. Right hilar mass versus adenopathy, with partial obstruction of segmental right upper lobe bronchi. 3. Mediastinal and hilar adenopathy consistent with nodal metastases. 4. Nonspecific left adrenal thickening, metastatic disease not excluded. 5. Nonspecific wall thickening of the gastric cardia. 6. 3.7 cm infrarenal abdominal aortic aneurysm. Recommend follow-up every 2 years. Reference: J Am Coll Radiol 1062;69:485-462. 7. 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 The bilateral profundus femoral arteries remain patent. 8. Cholelithiasis without cholecystitis. 9.  Aortic Atherosclerosis (ICD10-I70.0).   04/28/2021 Pathology Results   FINAL MICROSCOPIC DIAGNOSIS:  A. LUNG, LUL, BRUSHING:  - Malignant cells consistent with non-small cell carcinoma   B. LUNG, LUL, FINE NEEDLE ASPIRATION:  - Malignant cells consistent with non-small cell carcinoma   C. LYMPH NODE, 11R, FINE NEEDLE ASPIRATION:  - Malignant cells consistent with non-small cell carcinoma   ADDENDUM:  The malignant cells are positive with cytokeratin 5/6 and p63 and negative with TTF-1 consistent with squamous cell carcinoma.    04/28/2021 Cancer Staging   Staging form: Lung, AJCC 8th Edition - Clinical stage from 04/28/2021: Stage IVB (cT2, cN3, cM1c) - Signed by Truitt Merle, MD on 05/06/2021    05/05/2021 Initial Diagnosis   Squamous cell lung cancer, left ([place])   05/18/2021 PET scan   IMPRESSION: 1. Hypermetabolic rounded mass in the LEFT upper lobe most consistent with bronchogenic carcinoma. 2. Contralateral hypermetabolic paratracheal lymph node and RIGHT hilar lymph node metastasis. 3. No evidence of metastatic disease outside the thorax.   05/24/2021 -  Chemotherapy   Patient is on Treatment Plan : LUNG NSCLC  Carboplatin (6) + Paclitaxel (200) + Pembrolizumab (200) D1 q21d x 4 cycles / Pembrolizumab (200) Maintenance D1 q21d        INTERVAL HISTORY:  Cynthia Gonzalez is here for a follow up of metastatic lung cancer.   She noticed left side leg weakness about 3 days ago, she has to pull her leg, she is able to walk slowly but indecently, no wall, she plans to get a walker  She otherwise tolerated chemo well, she had moderate fatigue after chemo which lasted one day, she is eating OK, no nausea, BM has been normal.    All other systems were reviewed with the patient and are negative.  MEDICAL HISTORY:  Past Medical History:  Diagnosis Date   Allergy    Blood transfusion without reported diagnosis 1976   Hyperlipidemia     SURGICAL HISTORY: Past Surgical History:  Procedure Laterality Date   BRONCHIAL BIOPSY  04/28/2021   Procedure: BRONCHIAL BIOPSIES;  Surgeon: Garner Nash, DO;  Location: [facility];  Service: Pulmonary;;   BRONCHIAL BRUSHINGS  04/28/2021   Procedure: BRONCHIAL BRUSHINGS;  Surgeon: Garner Nash, DO;  Location: [facility];  Service: Pulmonary;;   BRONCHIAL NEEDLE ASPIRATION BIOPSY  04/28/2021   Procedure: BRONCHIAL NEEDLE ASPIRATION BIOPSIES;  Surgeon: Garner Nash, DO;  Location: [place];  Service: Pulmonary;;   DENTAL SURGERY     with sedative per pt, not sedated   IR IMAGING GUIDED PORT INSERTION  05/22/2021   TUBAL  LIGATION  1983   VAGINAL DELIVERY     x7-sedation with 1 delivery [place] N/A 04/28/2021   Procedure: VIDEO BRONCHOSCOPY WITH ENDOBRONCHIAL ULTRASOUND;  Surgeon: Garner Nash, DO;  Location: [place];  Service: Pulmonary;  Laterality: N/A;   VIDEO BRONCHOSCOPY WITH RADIAL ENDOBRONCHIAL ULTRASOUND  04/28/2021   Procedure: VIDEO BRONCHOSCOPY WITH RADIAL ENDOBRONCHIAL ULTRASOUND;  Surgeon: Garner Nash, DO;  Location: [facility];  Service: Pulmonary;;    I have reviewed the social  history and family history with the patient and they are unchanged from previous note.  ALLERGIES:  has No Known Allergies.  MEDICATIONS:  Current Outpatient Medications  Medication Sig Dispense Refill   atorvastatin (LIPITOR) 40 MG tablet Take 1 tablet (40 mg total) by mouth daily. 30 tablet 2   dexamethasone (DECADRON) 4 MG tablet Take 1 tablet (4 mg total) by mouth 3 (three) times daily for 7 days, THEN 1 tablet (4 mg total) 2 (two) times daily for 7 days, THEN 0.5 tablets (2 mg total) 2 (two) times daily for 7 days, THEN 0.5 tablets (2 mg total) daily for 7 days. 45.5 tablet 0   guaiFENesin-dextromethorphan (ROBITUSSIN DM) 100-10 MG/5ML syrup Take 10 mLs by mouth every 4 (four) hours as needed for cough. 118 mL 0   lidocaine-prilocaine (EMLA) cream Apply to affected area once 30 g 3   nystatin (MYCOSTATIN) 100000 UNIT/ML suspension Take 5 mLs (500,000 Units total) by mouth 4 (four) times daily. 473 mL 0   ondansetron (ZOFRAN) 8 MG tablet Take 1 tablet (8 mg total) by mouth 2 (two) times daily as needed for refractory nausea / vomiting. Start on day 3 after carboplatin chemo. 30 tablet 1   pantoprazole (PROTONIX) 40 MG tablet Take 1 tablet (40 mg total) by mouth daily. 30 tablet 0   prochlorperazine (COMPAZINE) 10 MG tablet Take 1 tablet (10 mg total) by mouth every 6 (six) hours as needed (Nausea or vomiting). 30 tablet 1   No current facility-administered medications for this visit.    PHYSICAL EXAMINATION: ECOG PERFORMANCE STATUS: 2 - Symptomatic, <50% confined to bed  No exam   LABORATORY DATA:  I have reviewed the data as listed CBC Latest Ref Rng & Units 05/24/2021 05/05/2021 04/28/2021  WBC 4.0 - 10.5 K/uL 15.3(H) 19.5(H) 18.2(H)  Hemoglobin 12.0 - 15.0 g/dL 14.3 14.6 12.3  Hematocrit 36.0 - 46.0 % 41.5 43.2 37.0  Platelets 150 - 400 K/uL 140(L) 398 446(H)     CMP Latest Ref Rng & Units 05/24/2021 05/05/2021 04/28/2021  Glucose 70 - 99 mg/dL 164(H) 125(H) -  BUN 8 - 23 mg/dL 18 19  -  Creatinine 0.44 - 1.00 mg/dL 0.60 0.80 -  Sodium 135 - 145 mmol/L 135 136 -  Potassium 3.5 - 5.1 mmol/L 4.2 4.0 -  Chloride 98 - 111 mmol/L 99 102 -  CO2 22 - 32 mmol/L 28 23 -  Calcium 8.9 - 10.3 mg/dL 8.4(L) 8.3(L) -  Total Protein 6.5 - 8.1 g/dL 5.8(L) 6.3(L) 6.4(L)  Total Bilirubin 0.3 - 1.2 mg/dL 0.4 0.3 0.2(L)  Alkaline Phos 38 - 126 U/L 73 80 91  AST 15 - 41 U/L 12(L) 13(L) 15  ALT 0 - 44 U/L 37 16 10      RADIOGRAPHIC STUDIES: I have personally reviewed the radiological images as listed and agreed with the findings in the report. No results found.    No orders of the defined types were placed in  this encounter.  All questions were answered. The patient knows to call the clinic with any problems, questions or concerns. No barriers to learning was detected. The total time spent in the appointment was 25 minutes.     Truitt Merle, MD 05/30/2021

## 2021-05-30 NOTE — Telephone Encounter (Signed)
Spoke with pt's daughter via telephone to confirm [place] Protocol at [facility] on 05/31/2021 @1030 .  Pt's daughter stated that Mont Dutton, RN had called her earlier to confirm appt.  Pt daughter has no questions or concerns at this time.

## 2021-05-31 ENCOUNTER — Ambulatory Visit (HOSPITAL_COMMUNITY)
Admission: RE | Admit: 2021-05-31 | Discharge: 2021-05-31 | Disposition: A | Discharge status: Home / Self Care | Payer: Medicare Other | Source: Ambulatory Visit | Attending: Hematology | Admitting: Hematology

## 2021-05-31 ENCOUNTER — Other Ambulatory Visit: Payer: Self-pay

## 2021-05-31 DIAGNOSIS — C3492 Malignant neoplasm of unspecified part of left bronchus or lung: Secondary | ICD-10-CM | POA: Insufficient documentation

## 2021-05-31 IMAGING — MR MR HEAD WO/W CM
11 of 15 series · 23 of 48 positions shown · IV contrast (gadavist)
Comparison: [DATE]

CLINICAL DATA: Metastatic disease evaluation; squamous cell lung
cancer

EXAM:
MRI HEAD WITHOUT AND WITH CONTRAST
TECHNIQUE: Multiplanar, multiecho pulse sequences of the brain and surrounding
structures were obtained without and with intravenous contrast.
CONTRAST:  5mL GADAVIST GADOBUTROL 1 MMOL/ML IV SOLN

[Series 2: FLAIR · sagittal · 3.0mm · 0.47mm/px · 1 of 36 slices shown (1 of 2)]
[im 1/36]
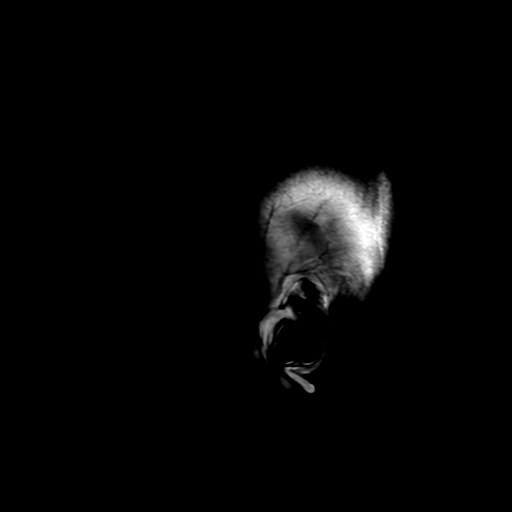

[Series 3: DWI · axial · 3.0mm · 0.94mm/px · z∈[-20,+123]mm · 2 of 98 slices shown (1 of 2)]
[im 1/98]
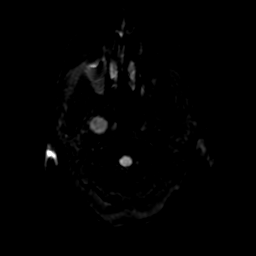
[im 98/98]
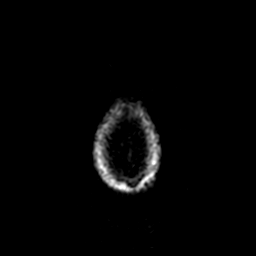

[Series 4: FLAIR · axial · 3.0mm · 0.47mm/px · 1 of 55 slices shown (2 of 2)]
[im 1/55]
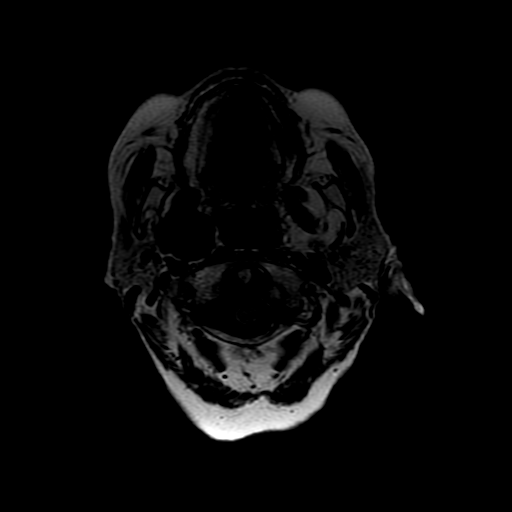

[Series 5: SWI · axial · 2.9mm · 0.47mm/px · z∈[-25,+126]mm · 3 of 102 slices shown]
[im 1/102]
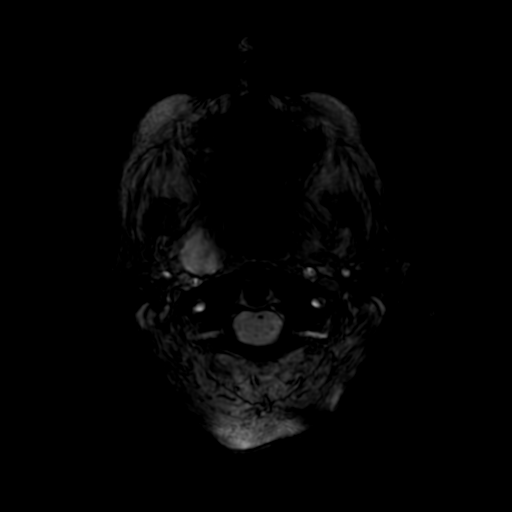
[im 51/102]
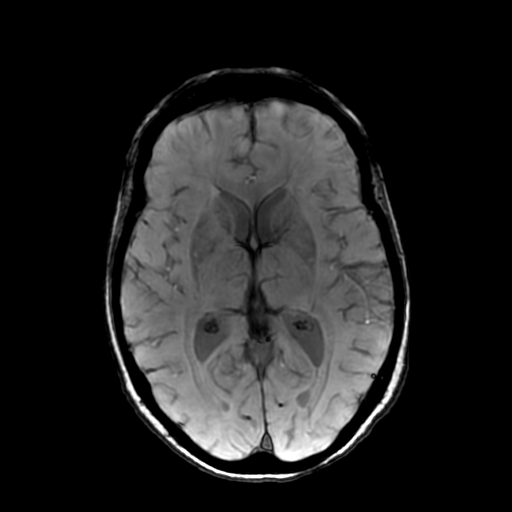
[im 102/102]
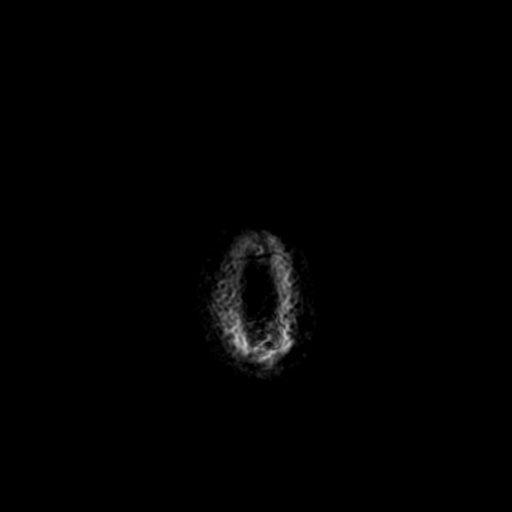

[Series 7: T2 post-contrast · coronal · 3.0mm · 0.39mm/px · 1 of 48 slices shown (1 of 2)]
[im 1/48]
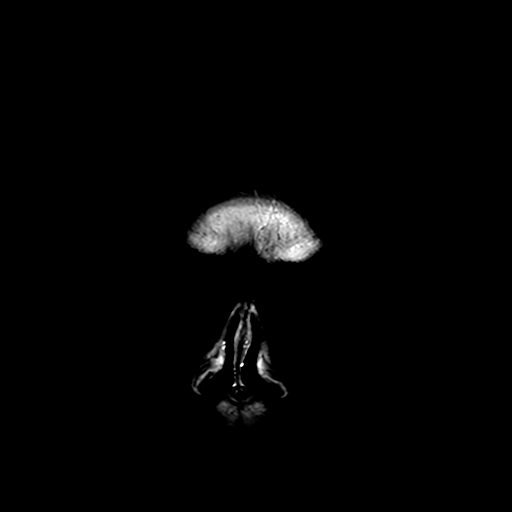

[Series 8: T2 post-contrast · axial · 5.0mm · 0.47mm/px · 1 of 29 slices shown (2 of 2)]
[im 1/29]
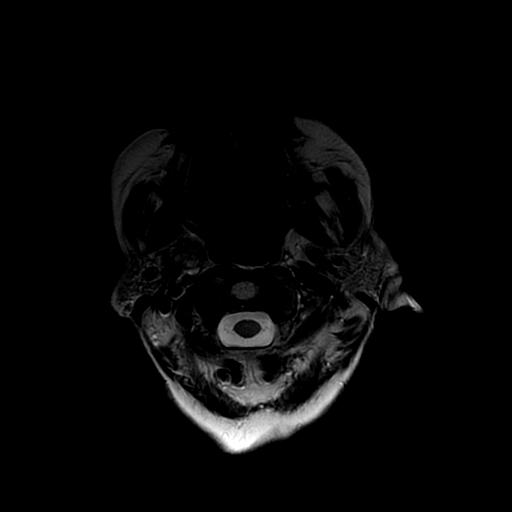

[Series 9: T1 post-contrast · coronal · 3.0mm · 0.39mm/px · 1 of 48 slices shown (1 of 2)]
[im 1/48]
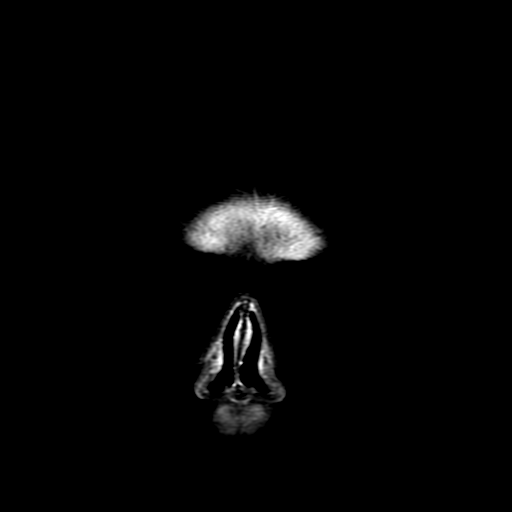

[Series 10: FLAIR post-contrast · sagittal · 3.0mm · 0.47mm/px · 1 of 36 slices shown]
[im 1/36]
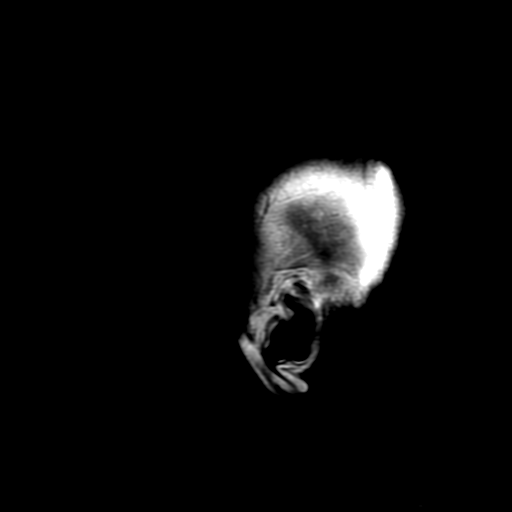

[Series 310: DWI · axial · 3.0mm · 0.94mm/px · z∈[-20,+123]mm · 3 of 98 slices shown (2 of 2)]
[im 1/98]
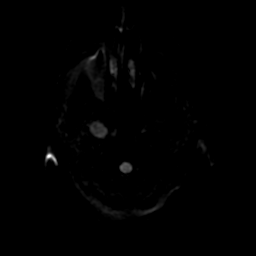
[im 49/98]
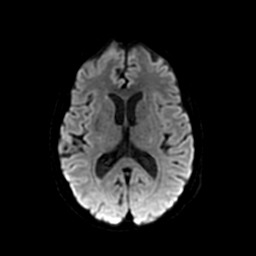
[im 98/98]
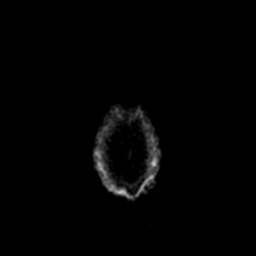

[Series 350: ADC · axial · 3.0mm · 0.94mm/px · 1 of 49 slices shown]
[im 1/49]
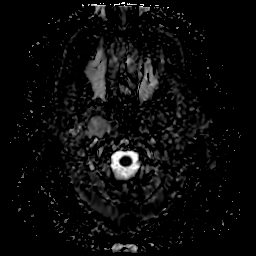

[Series 1100: T1 post-contrast · axial · 0.9mm · 0.50mm/px · z∈[-115,+140]mm · 8 of 301 slices shown (2 of 2)]
[im 1/301]
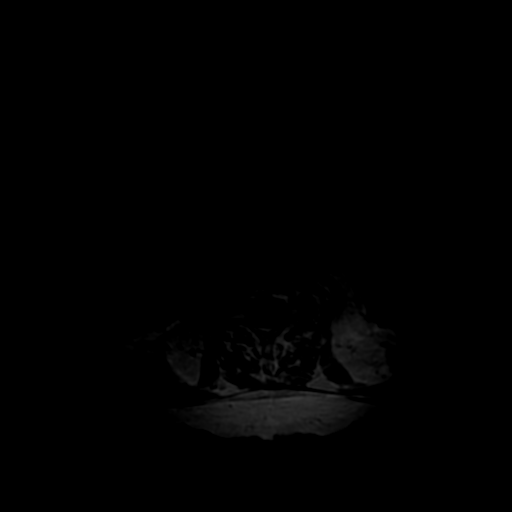
[im 43/301]
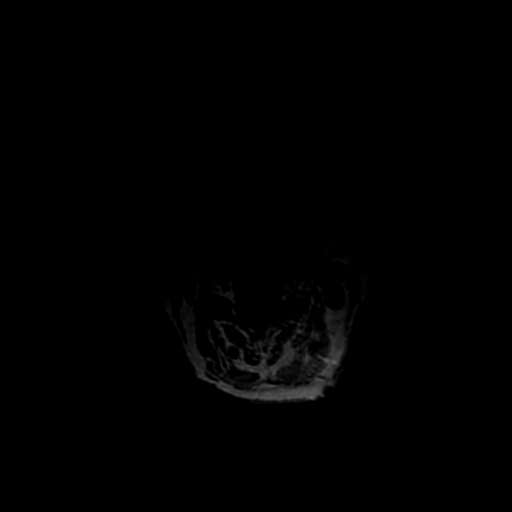
[im 86/301]
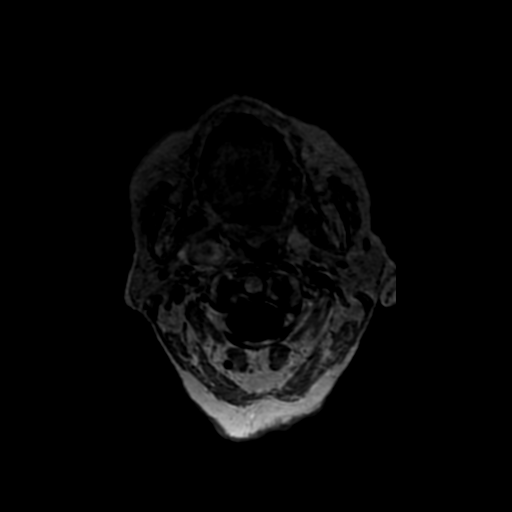
[im 129/301]
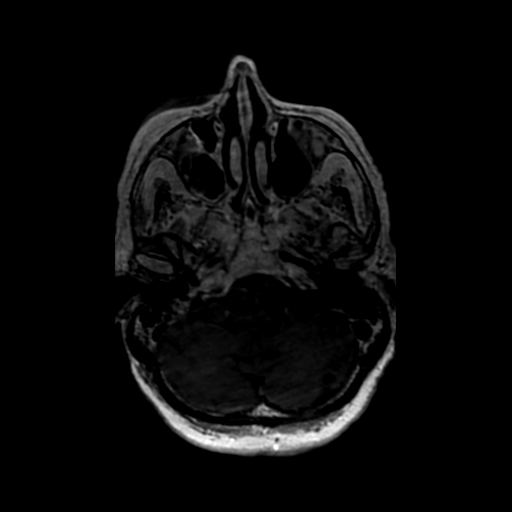
[im 172/301]
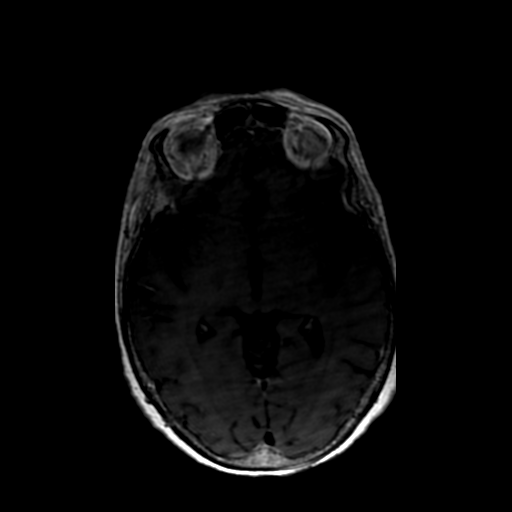
[im 215/301]
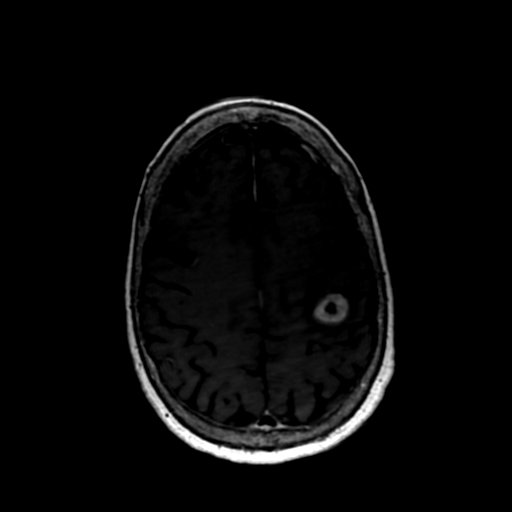
[im 258/301]
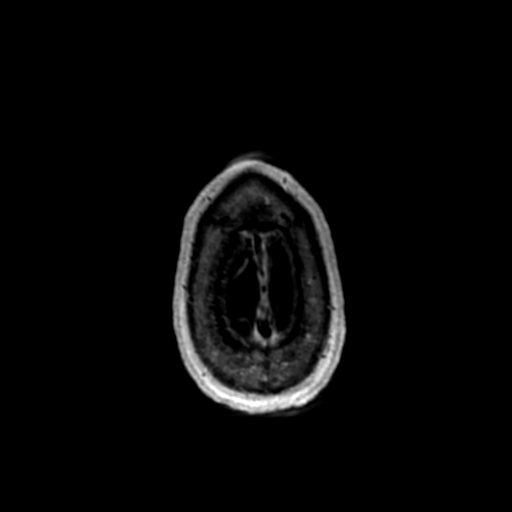
[im 301/301]
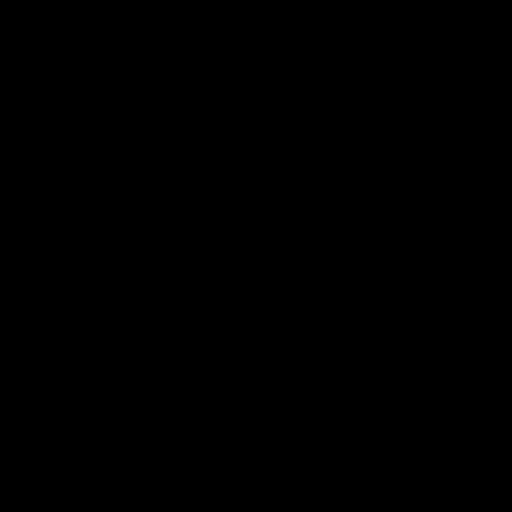

[23 of 48 positions shown; findings below may reference images not displayed]

FINDINGS: Brain: Enhancing left perirolandic lesion again identified.
Surrounding edema has decreased.

No new mass or abnormal enhancement.

Ventricles and sulci are stable in size and configuration. Patchy
foci of T2 hyperintensity in the supratentorial white matter
probably reflects an arm mild chronic microvascular ischemic
changes. No acute infraction or hemorrhage.

Vascular: Major vessel flow voids at the skull base are preserved.

Skull and upper cervical spine: Normal marrow signal is preserved.

Sinuses/Orbits: Mild mucosal thickening.  Orbits are unremarkable.

Other: Right parapharyngeal enhancing lesion is again identified.
Sella is unremarkable. Mastoid air cells are clear.
IMPRESSION: Stable size of treated left frontoparietal, perirolandic metastasis.
Decreased surrounding edema.

Unchanged right parapharyngeal neck mass. May represent a nerve
sheath tumor or deep lobe parotid primary neoplasm.

## 2021-05-31 MED ORDER — GADOBUTROL 1 MMOL/ML IV SOLN
5.0000 mL | Freq: Once | INTRAVENOUS | Status: AC | PRN
  Administered 2021-05-31: 5 mL via INTRAVENOUS

## 2021-06-01 ENCOUNTER — Telehealth: Payer: Self-pay | Admitting: Radiation Therapy

## 2021-06-01 NOTE — Telephone Encounter (Addendum)
Called Cynthia Gonzalez to share the results of the recent brain MRI and inquire about her Lt leg weakness. The Brain MRI demonstrated  stable size of treated lesion with decreased surrounding edema. Cynthia Gonzalez was happy to hear the good results and shared that her Lt sided weakness has improved. She is now able to walk around the house and made her own breakfast without any problems this morning.    I will reach out to Dr. Burr Medico regarding an update for the steroid taper instructions.  Note from 2/8:  -Dexamethasone tapering per Dr. Tammi Klippel, she was supposed to decrease to 2 mg twice daily, I told her to stay on 4 mg twice daily for now -We will call follow-up after MRI results returns.   06/02/21 - Called pt and instructed her to resume steroid taper as originally instructed by Dr. Tammi Klippel. If she notices a return in her weakness, I have asked her to call back and let us know.    Mont Dutton R.T.(R)(T) Radiation Special Procedures Navigator

## 2021-06-05 ENCOUNTER — Inpatient Hospital Stay: Payer: Medicare Other

## 2021-06-07 ENCOUNTER — Ambulatory Visit: Payer: Self-pay | Admitting: Urology

## 2021-06-07 ENCOUNTER — Encounter: Payer: Self-pay | Admitting: Urology

## 2021-06-07 NOTE — Progress Notes (Signed)
Spoke w/ patient's daughter 'Melody Musick', verified her identity and begin nursing interview. She reports that her mother, patient Cynthia Gonzalez is having overall muscle weakness and needs assistance standing from a supine position. No other issues reported at this time.  Meaningful use complete. Postmenopausal -No chances of pregnancy.  Notified patient's daughter of her mother;s 9:30am-06/08/21 telephone appointment w/ Ashlyn Bruning PA-C. I left my extension [phone] in case patient needs to call. Ms. Hollace Kinnier verbalized understanding of the information given.  Patient contact [phone] or [phone]

## 2021-06-08 ENCOUNTER — Other Ambulatory Visit: Payer: Self-pay | Admitting: Radiation Therapy

## 2021-06-08 ENCOUNTER — Ambulatory Visit
Admission: RE | Admit: 2021-06-08 | Discharge: 2021-06-08 | Disposition: A | Discharge status: Home / Self Care | Payer: Medicare Other | Source: Ambulatory Visit | Attending: Urology | Admitting: Urology

## 2021-06-08 DIAGNOSIS — C7931 Secondary malignant neoplasm of brain: Secondary | ICD-10-CM

## 2021-06-08 NOTE — Progress Notes (Signed)
Radiation Oncology         (336) [phone] ________________________________  Name: Cynthia Gonzalez MRN: [id number]  Date: 06/08/2021  DOB: June 11, 1950  Post Treatment Note  CC: Nolene Ebbs, MD  Kristeen Miss, MD  Diagnosis:   71 y.o. 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  Interval Since Last Radiation:  4.5 weeks  05/08/21//SRS brain PTV-1:   Narrative: I spoke with the patient to conduct her routine scheduled 1 month follow up visit via telephone to spare the patient unnecessary potential exposure in the healthcare setting during the current COVID-19 pandemic.  The patient was notified in advance and gave permission to proceed with this visit format.  She tolerated the [facility] treatment very well without any acute ill side effects.                              On review of systems, the patient states that she is doing well in general.  She has tapered off the steroids completely at this point and is doing well, without complaints.  She did have an episode of left lower extremity weakness on 05/30/2021 which prompted an early MRI brain scan which was completed on 05/31/2021.  Fortunately, this scan did not show any new or progressive disease in the brain and showed a stable size of the treated left frontoparietal metastasis with decreased surrounding edema.  The left lower extremity weakness did resolve spontaneously and has not recurred.  She specifically denies headaches, changes in visual or auditory acuity, dizziness/imbalance, focal weakness, paresthesias or difficulty with speech.  She is tolerating the systemic therapy well, having completed her first cycle of carboplatin/paclitaxel with Keytruda.  Overall, she is pleased with her progress to date.  ALLERGIES:  has No Known Allergies.  Meds: Current Outpatient Medications  Medication Sig Dispense Refill   atorvastatin (LIPITOR) 40 MG tablet Take 1  tablet (40 mg total) by mouth daily. 30 tablet 2   guaiFENesin-dextromethorphan (ROBITUSSIN DM) 100-10 MG/5ML syrup Take 10 mLs by mouth every 4 (four) hours as needed for cough. 118 mL 0   lidocaine-prilocaine (EMLA) cream Apply to affected area once 30 g 3   nystatin (MYCOSTATIN) 100000 UNIT/ML suspension Take 5 mLs (500,000 Units total) by mouth 4 (four) times daily. (Patient not taking: Reported on 06/07/2021) 473 mL 0   ondansetron (ZOFRAN) 8 MG tablet Take 1 tablet (8 mg total) by mouth 2 (two) times daily as needed for refractory nausea / vomiting. Start on day 3 after carboplatin chemo. 30 tablet 1   pantoprazole (PROTONIX) 40 MG tablet Take 1 tablet (40 mg total) by mouth daily. 30 tablet 0   prochlorperazine (COMPAZINE) 10 MG tablet Take 1 tablet (10 mg total) by mouth every 6 (six) hours as needed (Nausea or vomiting). 30 tablet 1   No current facility-administered medications for this encounter.    Physical Findings:  vitals were not taken for this visit.  Pain Assessment Pain Score: 0-No pain/10 Unable to assess due to telephone follow-up visit format.  Lab Findings: Lab Results  Component Value Date   WBC 15.3 (H) 05/24/2021   HGB 14.3 05/24/2021   HCT 41.5 05/24/2021   MCV 86.3 05/24/2021   PLT 140 (L) 05/24/2021     Radiographic Findings: MR Brain W Wo Contrast  Result Date: 05/31/2021 CLINICAL DATA:  Metastatic disease evaluation; squamous cell lung  cancer EXAM: MRI HEAD WITHOUT AND WITH CONTRAST TECHNIQUE: Multiplanar, multiecho pulse sequences of the brain and surrounding structures were obtained without and with intravenous contrast. CONTRAST:  76mL GADAVIST GADOBUTROL 1 MMOL/ML IV SOLN COMPARISON:  04/27/2021 FINDINGS: Brain: Enhancing left perirolandic lesion again identified. Surrounding edema has decreased. No new mass or abnormal enhancement. Ventricles and sulci are stable in size and configuration. Patchy foci of T2 hyperintensity in the supratentorial white  matter probably reflects an arm mild chronic microvascular ischemic changes. No acute infraction or hemorrhage. Vascular: Major vessel flow voids at the skull base are preserved. Skull and upper cervical spine: Normal marrow signal is preserved. Sinuses/Orbits: Mild mucosal thickening.  Orbits are unremarkable. Other: Right parapharyngeal enhancing lesion is again identified. Sella is unremarkable. Mastoid air cells are clear. IMPRESSION: Stable size of treated left frontoparietal, perirolandic metastasis. Decreased surrounding edema. Unchanged right parapharyngeal neck mass. May represent a nerve sheath tumor or deep lobe parotid primary neoplasm. Electronically Signed   By: Macy Mis M.D.   On: 05/31/2021 12:25   NM PET Image Initial (PI) Skull Base To Thigh  Result Date: 05/18/2021 CLINICAL DATA:  initial treatment strategy for non-small cell lung cancer. EXAM: NUCLEAR MEDICINE PET SKULL BASE TO THIGH TECHNIQUE: 5.9 mCi F-18 FDG was injected intravenously. Full-ring PET imaging was performed from the skull base to thigh after the radiotracer. CT data was obtained and used for attenuation correction and anatomic localization. Fasting blood glucose: 98 mg/dl COMPARISON:  None. FINDINGS: Mediastinal blood pool activity: SUV max 2.5 Liver activity: SUV max NA NECK: No hypermetabolic lymph nodes in the neck. Incidental CT findings: none CHEST: Rounded hypermetabolic mass in the LEFT upper lobe measures 3.2 x 2.9 cm with intense metabolic activity SUV max equal 32.9. Small gas cavitation within the mass. No additional hypermetabolic nodules. Hypermetabolic contralateral RIGHT lower paratracheal and hilar hyper hypermetabolic nodes. RIGHT paratracheal node with SUV max equal 8.5. RIGHT hilar node with SUV max 8.6. Smaller more superior hypermetabolic nodes in the mediastinum. Incidental CT findings: none ABDOMEN/PELVIS: No abnormal hypermetabolic activity within the liver, pancreas, adrenal glands, or spleen.  No hypermetabolic lymph nodes in the abdomen or pelvis. Incidental CT findings: none SKELETON: No focal hypermetabolic activity to suggest skeletal metastasis. Incidental CT findings: none IMPRESSION: 1. Hypermetabolic rounded mass in the LEFT upper lobe most consistent with bronchogenic carcinoma. 2. Contralateral hypermetabolic paratracheal lymph node and RIGHT hilar lymph node metastasis. 3. No evidence of metastatic disease outside the thorax. Electronically Signed   By: Suzy Bouchard M.D.   On: 05/18/2021 16:59   IR IMAGING GUIDED PORT INSERTION  Result Date: 05/22/2021 INDICATION: 71 year old female with recently diagnosed left upper lobe non-small cell lung cancer metastatic to the brain. She presents for port catheter placement to facilitate chemotherapy. EXAM: IMPLANTED PORT A CATH PLACEMENT WITH ULTRASOUND AND FLUOROSCOPIC GUIDANCE MEDICATIONS: None. ANESTHESIA/SEDATION: Versed 1.5 mg IV; Fentanyl 50 mcg IV; Moderate Sedation Time:  17 minutes The patient was continuously monitored during the procedure by the interventional radiology nurse under my direct supervision. FLUOROSCOPY TIME:  0 minutes, 36 seconds (1 mGy) COMPLICATIONS: None immediate. PROCEDURE: The right neck and chest was prepped with chlorhexidine, and draped in the usual sterile fashion using maximum barrier technique (cap and mask, sterile gown, sterile gloves, large sterile sheet, hand hygiene and cutaneous antiseptic). Local anesthesia was attained by infiltration with 1% lidocaine with epinephrine. Ultrasound demonstrated patency of the right internal jugular vein, and this was documented with an image. Under real-time ultrasound guidance, this  vein was accessed with a 21 gauge micropuncture needle and image documentation was performed. A small dermatotomy was made at the access site with an 11 scalpel. A 0.018" wire was advanced into the SVC and the access needle exchanged for a 66F micropuncture vascular sheath. The 0.018" wire  was then removed and a 0.035" wire advanced into the IVC. An appropriate location for the subcutaneous reservoir was selected below the clavicle and an incision was made through the skin and underlying soft tissues. The subcutaneous tissues were then dissected using a combination of blunt and sharp surgical technique and a pocket was formed. A single lumen power injectable portacatheter was then tunneled through the subcutaneous tissues from the pocket to the dermatotomy and the port reservoir placed within the subcutaneous pocket. The venous access site was then serially dilated and a peel away vascular sheath placed over the wire. The wire was removed and the port catheter advanced into position under fluoroscopic guidance. The catheter tip is positioned in the superior cavoatrial junction. This was documented with a spot image. The portacatheter was then tested and found to flush and aspirate well. The port was flushed with saline followed by 100 units/mL heparinized saline. The pocket was then closed in two layers using first subdermal inverted interrupted absorbable sutures followed by a running subcuticular suture. The epidermis was then sealed with Dermabond. The dermatotomy at the venous access site was also closed with Dermabond. IMPRESSION: Successful placement of a right IJ approach Power Port with ultrasound and fluoroscopic guidance. The catheter is ready for use. Electronically Signed   By: Jacqulynn Cadet M.D.   On: 05/22/2021 14:27    Impression/Plan: 1. 71 y.o. 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 She appears to have recovered well from the effects of her recent SRS brain treatment and is currently without complaints.  She had a recent, early posttreatment MRI brain scan on 05/31/2021 due to new symptoms of left lower extremity weakness and fortunately, this scan did not show any new or progressive  disease in the brain and showed a stable size of the treated left frontoparietal metastasis with decreased surrounding edema. The left lower extremity weakness did resolve spontaneously and has not recurred.  We discussed the plan to obtain another MRI brain scan in approximately 3 months, to assess her treatment response and pending this scan is stable, we will then proceed with serial MRI brain scans every 3 months to continue to monitor for any evidence of disease progression or recurrence.  She appears to have a good understanding of our recommendations and is comfortable and in agreement with the stated plan.  I will plan to call her by phone to review the results and recommendations following each scan but she knows to call anytime in the interim, should she have any questions or concerns.    Nicholos Johns, PA-C

## 2021-06-12 ENCOUNTER — Encounter (HOSPITAL_COMMUNITY): Payer: Self-pay

## 2021-06-14 ENCOUNTER — Encounter: Payer: Self-pay | Admitting: Hematology

## 2021-06-14 MED FILL — Dexamethasone Sodium Phosphate Inj 100 MG/10ML: INTRAMUSCULAR | Qty: 1 | Status: AC

## 2021-06-14 MED FILL — Fosaprepitant Dimeglumine For IV Infusion 150 MG (Base Eq): INTRAVENOUS | Qty: 5 | Status: AC

## 2021-06-14 NOTE — Progress Notes (Signed)
°  Radiation Oncology         (336) [phone] ________________________________  Name: Cynthia Gonzalez MRN: [id number]  Date: 05/08/2021  DOB: 1950/07/27  End of Treatment Note  Diagnosis:   71 y.o. woman with 1.9 cm solitary frontoparietal brain metastasis     Indication for treatment:  Palliation       Radiation treatment dates:   05/08/21  Site/dose/beams/energy:   Cynthia Gonzalez received stereotactic radiosurgery to the following targets: Left Frontal 19 mm target was treated using 6 Rapid Arc VMAT Beams to a prescription dose of 20 Gy.  ExacTrac registration was performed for each couch angle.  The 100% isodose line was prescribed.  6 MV X-rays were delivered in the flattening filter free beam mode.  Narrative: The patient tolerated radiation treatment relatively well.     Plan: The patient has completed radiation treatment. The patient will return to radiation oncology clinic for routine followup in one month. I advised her to call or return sooner if she has any questions or concerns related to her recovery or treatment. ________________________________  Sheral Apley. Tammi Klippel, M.D.

## 2021-06-15 ENCOUNTER — Inpatient Hospital Stay (HOSPITAL_BASED_OUTPATIENT_CLINIC_OR_DEPARTMENT_OTHER): Payer: Medicare Other | Admitting: Hematology

## 2021-06-15 ENCOUNTER — Inpatient Hospital Stay: Payer: Medicare Other | Admitting: Nutrition

## 2021-06-15 ENCOUNTER — Encounter: Payer: Self-pay | Admitting: Hematology

## 2021-06-15 ENCOUNTER — Telehealth: Payer: Self-pay

## 2021-06-15 ENCOUNTER — Other Ambulatory Visit: Payer: Self-pay

## 2021-06-15 ENCOUNTER — Inpatient Hospital Stay: Payer: Medicare Other

## 2021-06-15 VITALS — BP 95/57 | HR 102 | Temp 98.3°F | Resp 18 | Ht 65.0 in

## 2021-06-15 VITALS — HR 88 | Ht 65.0 in | Wt 108.5 lb

## 2021-06-15 DIAGNOSIS — C3492 Malignant neoplasm of unspecified part of left bronchus or lung: Secondary | ICD-10-CM | POA: Diagnosis not present

## 2021-06-15 DIAGNOSIS — Z95828 Presence of other vascular implants and grafts: Secondary | ICD-10-CM | POA: Insufficient documentation

## 2021-06-15 DIAGNOSIS — C7931 Secondary malignant neoplasm of brain: Secondary | ICD-10-CM | POA: Diagnosis not present

## 2021-06-15 DIAGNOSIS — Z5111 Encounter for antineoplastic chemotherapy: Secondary | ICD-10-CM | POA: Diagnosis not present

## 2021-06-15 LAB — CBC WITH DIFFERENTIAL (CANCER CENTER ONLY)
Abs Immature Granulocytes: 1.21 10*3/uL — ABNORMAL HIGH (ref 0.00–0.07)
Basophils Absolute: 0.1 10*3/uL (ref 0.0–0.1)
Basophils Relative: 1 %
Eosinophils Absolute: 0 10*3/uL (ref 0.0–0.5)
Eosinophils Relative: 0 %
HCT: 37.2 % (ref 36.0–46.0)
Hemoglobin: 12.4 g/dL (ref 12.0–15.0)
Immature Granulocytes: 5 %
Lymphocytes Relative: 9 %
Lymphs Abs: 2.4 10*3/uL (ref 0.7–4.0)
MCH: 29.1 pg (ref 26.0–34.0)
MCHC: 33.3 g/dL (ref 30.0–36.0)
MCV: 87.3 fL (ref 80.0–100.0)
Monocytes Absolute: 1.6 10*3/uL — ABNORMAL HIGH (ref 0.1–1.0)
Monocytes Relative: 6 %
Neutro Abs: 20.4 10*3/uL — ABNORMAL HIGH (ref 1.7–7.7)
Neutrophils Relative %: 79 %
Platelet Count: 305 10*3/uL (ref 150–400)
RBC: 4.26 MIL/uL (ref 3.87–5.11)
RDW: 15.8 % — ABNORMAL HIGH (ref 11.5–15.5)
WBC Count: 25.6 10*3/uL — ABNORMAL HIGH (ref 4.0–10.5)
nRBC: 0.1 % (ref 0.0–0.2)

## 2021-06-15 LAB — TSH: TSH: 1.105 u[IU]/mL (ref 0.308–3.960)

## 2021-06-15 LAB — CMP (CANCER CENTER ONLY)
ALT: 22 U/L (ref 0–44)
AST: 14 U/L — ABNORMAL LOW (ref 15–41)
Albumin: 3 g/dL — ABNORMAL LOW (ref 3.5–5.0)
Alkaline Phosphatase: 140 U/L — ABNORMAL HIGH (ref 38–126)
Anion gap: 5 (ref 5–15)
BUN: 16 mg/dL (ref 8–23)
CO2: 31 mmol/L (ref 22–32)
Calcium: 8.4 mg/dL — ABNORMAL LOW (ref 8.9–10.3)
Chloride: 103 mmol/L (ref 98–111)
Creatinine: 0.38 mg/dL — ABNORMAL LOW (ref 0.44–1.00)
GFR, Estimated: >60 mL/min (ref >60)
Glucose, Bld: 82 mg/dL (ref 70–99)
Potassium: 3.3 mmol/L — ABNORMAL LOW (ref 3.5–5.1)
Sodium: 139 mmol/L (ref 135–145)
Total Bilirubin: 0.4 mg/dL (ref 0.3–1.2)
Total Protein: 5.5 g/dL — ABNORMAL LOW (ref 6.5–8.1)

## 2021-06-15 MED ORDER — SODIUM CHLORIDE 0.9 % IV SOLN: Freq: Once | INTRAVENOUS | Status: AC

## 2021-06-15 MED ORDER — SODIUM CHLORIDE 0.9 % IV SOLN
150.0000 mg/m2 | Freq: Once | INTRAVENOUS | Status: DC
  Filled 2021-06-15: qty 40

## 2021-06-15 MED ORDER — FAMOTIDINE IN NACL 20-0.9 MG/50ML-% IV SOLN
20.0000 mg | Freq: Once | INTRAVENOUS | Status: AC
  Administered 2021-06-15: 20 mg via INTRAVENOUS
  Filled 2021-06-15: qty 50

## 2021-06-15 MED ORDER — POTASSIUM CHLORIDE CRYS ER 20 MEQ PO TBCR
20.0000 meq | EXTENDED_RELEASE_TABLET | Freq: Every day | ORAL | 0 refills | Status: AC
Start: 2021-06-15 — End: ?

## 2021-06-15 MED ORDER — SODIUM CHLORIDE 0.9 % IV SOLN
190.0000 mg | Freq: Once | INTRAVENOUS | Status: AC
  Administered 2021-06-15: 190 mg via INTRAVENOUS
  Filled 2021-06-15: qty 19

## 2021-06-15 MED ORDER — SODIUM CHLORIDE 0.9 % IV SOLN
150.0000 mg/m2 | Freq: Once | INTRAVENOUS | Status: AC
  Administered 2021-06-15: 228 mg via INTRAVENOUS
  Filled 2021-06-15: qty 38

## 2021-06-15 MED ORDER — SODIUM CHLORIDE 0.9% FLUSH
10.0000 mL | INTRAVENOUS | Status: DC | PRN
  Administered 2021-06-15: 10 mL

## 2021-06-15 MED ORDER — SODIUM CHLORIDE 0.9 % IV SOLN
205.2000 mg | Freq: Once | INTRAVENOUS | Status: DC
  Filled 2021-06-15: qty 21

## 2021-06-15 MED ORDER — SODIUM CHLORIDE 0.9 % IV SOLN
10.0000 mg | Freq: Once | INTRAVENOUS | Status: AC
  Administered 2021-06-15: 10 mg via INTRAVENOUS
  Filled 2021-06-15: qty 10

## 2021-06-15 MED ORDER — HEPARIN SOD (PORK) LOCK FLUSH 100 UNIT/ML IV SOLN
500.0000 [IU] | Freq: Once | INTRAVENOUS | Status: AC | PRN
  Administered 2021-06-15: 500 [IU]

## 2021-06-15 MED ORDER — SODIUM CHLORIDE 0.9 % IV SOLN
200.0000 mg | Freq: Once | INTRAVENOUS | Status: AC
  Administered 2021-06-15: 200 mg via INTRAVENOUS
  Filled 2021-06-15: qty 200

## 2021-06-15 MED ORDER — PALONOSETRON HCL INJECTION 0.25 MG/5ML
0.2500 mg | Freq: Once | INTRAVENOUS | Status: AC
  Administered 2021-06-15: 0.25 mg via INTRAVENOUS
  Filled 2021-06-15: qty 5

## 2021-06-15 MED ORDER — SODIUM CHLORIDE 0.9 % IV SOLN
150.0000 mg | Freq: Once | INTRAVENOUS | Status: AC
  Administered 2021-06-15: 150 mg via INTRAVENOUS
  Filled 2021-06-15: qty 150

## 2021-06-15 MED ORDER — DIPHENHYDRAMINE HCL 50 MG/ML IJ SOLN
25.0000 mg | Freq: Once | INTRAMUSCULAR | Status: AC
  Administered 2021-06-15: 25 mg via INTRAVENOUS
  Filled 2021-06-15: qty 1

## 2021-06-15 MED ORDER — SODIUM CHLORIDE 0.9% FLUSH
10.0000 mL | Freq: Once | INTRAVENOUS | Status: AC
  Administered 2021-06-15: 10 mL

## 2021-06-15 NOTE — Patient Instructions (Signed)
[facility] ONCOLOGY  Discharge Instructions: Thank you for choosing [facility] to provide your oncology and hematology care.   If you have a lab appointment with the [facility], please go directly to the [facility] and check in at the registration area.   Wear comfortable clothing and clothing appropriate for easy access to any Portacath or PICC line.   We strive to give you quality time with your provider. You may need to reschedule your appointment if you arrive late (15 or more minutes).  Arriving late affects you and other patients whose appointments are after yours.  Also, if you miss three or more appointments without notifying the office, you may be dismissed from the clinic at the providers discretion.      For prescription refill requests, have your pharmacy contact our office and allow 72 hours for refills to be completed.    Today you received the following chemotherapy and/or immunotherapy agents keytruda, paclitaxel, carboplatin      To help prevent nausea and vomiting after your treatment, we encourage you to take your nausea medication as directed.  BELOW ARE SYMPTOMS THAT SHOULD BE REPORTED IMMEDIATELY: *FEVER GREATER THAN 100.4 F (38 C) OR HIGHER *CHILLS OR SWEATING *NAUSEA AND VOMITING THAT IS NOT CONTROLLED WITH YOUR NAUSEA MEDICATION *UNUSUAL SHORTNESS OF BREATH *UNUSUAL BRUISING OR BLEEDING *URINARY PROBLEMS (pain or burning when urinating, or frequent urination) *BOWEL PROBLEMS (unusual diarrhea, constipation, pain near the anus) TENDERNESS IN MOUTH AND THROAT WITH OR WITHOUT PRESENCE OF ULCERS (sore throat, sores in mouth, or a toothache) UNUSUAL RASH, SWELLING OR PAIN  UNUSUAL VAGINAL DISCHARGE OR ITCHING   Items with * indicate a potential emergency and should be followed up as soon as possible or go to the Emergency Department if any problems should occur.  Please show the CHEMOTHERAPY ALERT CARD or IMMUNOTHERAPY  ALERT CARD at check-in to the Emergency Department and triage nurse.  Should you have questions after your visit or need to cancel or reschedule your appointment, please contact [facility]  Dept: [phone]  and follow the prompts.  Office hours are 8:00 a.m. to 4:30 p.m. Monday - Friday. Please note that voicemails left after 4:00 p.m. may not be returned until the following business day.  We are closed weekends and major holidays. You have access to a nurse at all times for urgent questions. Please call the main number to the clinic Dept: [phone] and follow the prompts.   For any non-urgent questions, you may also contact your provider using MyChart. We now offer e-Visits for anyone 32 and older to request care online for non-urgent symptoms. For details visit [email or web address].   Also download the MyChart app! Go to the app store, search "MyChart", open the app, select [facility], and log in with your MyChart username and password.  Due to Covid, a mask is required upon entering the hospital/clinic. If you do not have a mask, one will be given to you upon arrival. For doctor visits, patients may have 1 support person aged 38 or older with them. For treatment visits, patients cannot have anyone with them due to current Covid guidelines and our immunocompromised population.

## 2021-06-15 NOTE — Telephone Encounter (Signed)
Faxed home health referral to [facility] in [place] [phone]).  Referral for home health and physical therapy.  Fax confirmation received.  Awaiting pt acceptance from [facility].

## 2021-06-15 NOTE — Progress Notes (Signed)
[place]   [phone]) [phone] [phone]) [phone]   Clinic Follow up Note   Cynthia Gonzalez Care Team: Nolene Ebbs, MD as PCP - General (Internal Medicine) Valrie Hart, RN as Oncology Nurse Navigator (Oncology) Truitt Merle, MD as Consulting Physician (Hematology) Tyler Pita, MD as Consulting Physician (Radiation Oncology) Garner Nash, DO as Consulting Physician (Pulmonary Disease)  Date of Service:  06/15/2021  CHIEF COMPLAINT: f/u of metastatic lung cancer  CURRENT THERAPY:  First-line carboplatin/paclitaxel with Ballard Russell, starting 05/24/21  ASSESSMENT & PLAN:  Cynthia Gonzalez is a 71 y.o. female with   1. Squamous Cell LUL Lung Cancer, with nodal and brain metastasis -presented to ED 04/26/21 with right hand and wrist swelling and weakness. She was also found to have left-sided facial dropping on physical exam. Head CT revealed a 1.7 cm left frontal lobe mass and a right parapharyngeal space mass. CT CAP showed a LUL mass, right hilar mass, mediastinal and hilar adenopathy, as well as nonspecific left adrenal and gastric cardia wall thickening.  -inpatient bronchoscopy 04/28/21 by Dr. Valeta Harms and biopsy of LUL lung mass and 11R node both confirmed non-small cell carcinoma, IHC consistent with squamous cell histology. Will obtain PD-L1 expression on her biopsy  -she started first-line chemotherapy, carboplatin and paclitaxel with Keytruda every 3 weeks, on 05/24/2021.  She tolerated first cycle of chemo overall well, but she has developed b/l leg weakness. We will plan for restaging scan after 3-4 cycles, depending on her side effects. -labs reviewed, potassium is low. I will call in KCL today. Adequate to proceed with treatment today. However, given her weakness, we will reduce her dose. No need for G-CSF this cycle.  2. Weakness, Malnutrition -She has developed new onset leg weakness following first cycle chemo. Brain MRI 05/31/21 showed stable size of  treated metastasis, decreased surrounding edema. -exam today shows bilateral lower extremity weakness. I recommend physical therapy. She agrees to home PT. -many levels on her CMP today are low. She endorses drinking 1-2 Boost per day, I advised her to increase to 3-4. We will provide her some today.   3. Oligo brian metastasis -she received one fraction of SRS on 05/08/21 under Dr. Tammi Klippel. -Dexamethasone tapering per Dr. Tammi Klippel -surveillance brain MRI scheduled for 08/31/21   4. 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 -She is only on dexamethasone 2 mg daily now, will be weaned off soon   PLAN: -proceed with C2 carbo/taxol/Keytruda today with reduced dose due to her generalized weakness  -provided pt with complementary Ensure -I called in KCL -referral to home PT -lab, flush, f/u, and C3 carbo/taxol/Keytruda in 3 weeks as scheduled -She will taper off dexamethasone per Dr. Johny Shears direction -Protein and nutrition consult    No problem-specific Assessment & Plan notes found for this encounter.   SUMMARY OF ONCOLOGIC HISTORY: Oncology History Overview Note   Cancer Staging  Squamous cell lung cancer, left [facility]) Staging form: Lung, AJCC 8th Edition - Clinical stage from 04/28/2021: Stage IVB (cT2, cN3, cM1c) - Signed by Truitt Merle, MD on 05/06/2021     Brain metastasis ([place])  04/26/2021 Imaging   EXAM: CT HEAD WITHOUT CONTRAST  IMPRESSION: 1.  Approximally 1.7 cm left frontal lobe mass with moderate surrounding vasogenic edema and minimal mass effect. May represent metastatic or primary lesion. MRI brain with contrast recommended as well as oncologic workup as indicated. 2.  Partially visualized hypodense mass in the right neck parapharyngeal space. Indeterminate but could also represent metastasis given the brain findings. Could also be evaluated further with CT neck with contrast.   04/27/2021 Imaging   EXAM: MRI HEAD WITHOUT AND WITH CONTRAST  IMPRESSION: 1. 1.9 x 1.5 x 1.9 cm intraparenchymal mass involving the posterior left frontoparietal region with surrounding vasogenic edema. 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 2. No other acute intracranial process. 3. 2.1 x 2.3 cm heterogeneous mass within the right parapharyngeal space, better evaluated on prior neck CT.   04/27/2021 Initial Diagnosis   Brain metastasis (H[facility])   Squamous cell lung cancer, left ([place])  04/26/2021 Imaging   EXAM: CT NECK WITH CONTRAST  IMPRESSION: 1. Heterogeneously enhancing, centrally hypodense mass in the right parapharyngeal space, favored to represent an enlarged, necrotic right level 2A lymph node, concerning for metastatic disease. Biopsy is recommended. 2. No other acute finding in the neck.   04/26/2021 Imaging   EXAM: CT CHEST, ABDOMEN, AND PELVIS WITH CONTRAST  IMPRESSION: 1. Large spiculated left upper lobe mass consistent with primary lung cancer. 2. Right hilar mass versus adenopathy, with partial obstruction of segmental right upper lobe bronchi. 3. Mediastinal and hilar adenopathy consistent with nodal metastases. 4. Nonspecific left adrenal thickening, metastatic disease not excluded. 5. Nonspecific wall thickening of the gastric cardia. 6. 3.7 cm infrarenal abdominal aortic aneurysm. Recommend follow-up every 2 years. Reference: J Am Coll Radiol 6433;29:518-841. 7. 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 The bilateral profundus femoral arteries remain patent. 8. Cholelithiasis without cholecystitis. 9.  Aortic Atherosclerosis (ICD10-I70.0).   04/28/2021  Pathology Results   FINAL MICROSCOPIC DIAGNOSIS:  A. LUNG, LUL, BRUSHING:  - Malignant cells consistent with non-small cell carcinoma   B. LUNG, LUL, FINE NEEDLE ASPIRATION:  - Malignant cells consistent with non-small cell carcinoma   C. LYMPH NODE, 11R, FINE NEEDLE ASPIRATION:  - Malignant cells consistent with non-small cell carcinoma   ADDENDUM:  The malignant cells are positive with cytokeratin 5/6 and p63 and negative with TTF-1 consistent with squamous cell carcinoma.    04/28/2021 Cancer Staging   Staging form: Lung, AJCC 8th Edition - Clinical stage from 04/28/2021: Stage IVB (cT2, cN3, cM1c) - Signed by Truitt Merle, MD on 05/06/2021    05/05/2021 Initial Diagnosis   Squamous cell lung cancer, left ([place])   05/18/2021 PET scan   IMPRESSION: 1. Hypermetabolic rounded mass in the LEFT upper lobe most consistent with bronchogenic carcinoma. 2. Contralateral hypermetabolic paratracheal lymph node and RIGHT hilar lymph node metastasis. 3. No evidence of metastatic disease outside the thorax.   05/24/2021 -  Chemotherapy   Cynthia Gonzalez is on Treatment Plan : LUNG NSCLC Carboplatin (6) + Paclitaxel (200) + Pembrolizumab (200) D1 q21d x 4 cycles / Pembrolizumab (200) Maintenance D1 q21d        INTERVAL HISTORY:  Cynthia Gonzalez is here for a follow up of metastatic lung cancer. She was last seen by me on 05/30/21. She presents to the clinic alone. She reports her left leg remains weak, denies issues with her right. However, on exam, they seem about the same to me. She also notes her feet are starting to swell, which she attributes to halving her  steroid dose recently. She also reports she sleeps a lot, in bed about half the day. She denies fever, chills, diarrhea. She reports she is eating well.   All other systems were reviewed with the Cynthia Gonzalez and are negative.  MEDICAL HISTORY:  Past Medical History:  Diagnosis Date   Allergy    Blood transfusion without reported diagnosis 1976    Hyperlipidemia     SURGICAL HISTORY: Past Surgical History:  Procedure Laterality Date   BRONCHIAL BIOPSY  04/28/2021   Procedure: BRONCHIAL BIOPSIES;  Surgeon: Garner Nash, DO;  Location: [place] [facility];  Service: Pulmonary;;   BRONCHIAL BRUSHINGS  04/28/2021   Procedure: BRONCHIAL BRUSHINGS;  Surgeon: Garner Nash, DO;  Location: [place];  Service: Pulmonary;;   BRONCHIAL NEEDLE ASPIRATION BIOPSY  04/28/2021   Procedure: BRONCHIAL NEEDLE ASPIRATION BIOPSIES;  Surgeon: Garner Nash, DO;  Location: [place];  Service: Pulmonary;;   DENTAL SURGERY     with sedative per pt, not sedated   IR IMAGING GUIDED PORT INSERTION  05/22/2021   TUBAL LIGATION  1983   VAGINAL DELIVERY     x7-sedation with 1 delivery [place] N/A 04/28/2021   Procedure: VIDEO BRONCHOSCOPY WITH ENDOBRONCHIAL ULTRASOUND;  Surgeon: Garner Nash, DO;  Location: [place];  Service: Pulmonary;  Laterality: N/A;   VIDEO BRONCHOSCOPY WITH RADIAL ENDOBRONCHIAL ULTRASOUND  04/28/2021   Procedure: VIDEO BRONCHOSCOPY WITH RADIAL ENDOBRONCHIAL ULTRASOUND;  Surgeon: Garner Nash, DO;  Location: [place] [facility];  Service: Pulmonary;;    I have reviewed the social history and family history with the Cynthia Gonzalez and they are unchanged from previous note.  ALLERGIES:  has No Known Allergies.  MEDICATIONS:  Current Outpatient Medications  Medication Sig Dispense Refill   potassium chloride SA (KLOR-CON M) 20 MEQ tablet Take 1 tablet (20 mEq total) by mouth daily. 10 tablet 0   atorvastatin (LIPITOR) 40 MG tablet Take 1 tablet (40 mg total) by mouth daily. 30 tablet 2   guaiFENesin-dextromethorphan (ROBITUSSIN DM) 100-10 MG/5ML syrup Take 10 mLs by mouth every 4 (four) hours as needed for cough. 118 mL 0   lidocaine-prilocaine (EMLA) cream Apply to affected area once 30 g 3   nystatin (MYCOSTATIN) 100000 UNIT/ML suspension Take 5 mLs (500,000 Units total) by mouth 4 (four)  times daily. (Cynthia Gonzalez not taking: Reported on 06/07/2021) 473 mL 0   ondansetron (ZOFRAN) 8 MG tablet Take 1 tablet (8 mg total) by mouth 2 (two) times daily as needed for refractory nausea / vomiting. Start on day 3 after carboplatin chemo. 30 tablet 1   pantoprazole (PROTONIX) 40 MG tablet Take 1 tablet (40 mg total) by mouth daily. 30 tablet 0   prochlorperazine (COMPAZINE) 10 MG tablet Take 1 tablet (10 mg total) by mouth every 6 (six) hours as needed (Nausea or vomiting). 30 tablet 1   No current facility-administered medications for this visit.   Facility-Administered Medications Ordered in Other Visits  Medication Dose Route Frequency Provider Last Rate Last Admin   CARBOplatin (PARAPLATIN) 190 mg in sodium chloride 0.9 % 100 mL chemo infusion  190 mg Intravenous Once Truitt Merle, MD       heparin lock flush 100 unit/mL  500 Units Intracatheter Once PRN Truitt Merle, MD       PACLitaxel (TAXOL) 228 mg in sodium chloride 0.9 % 250 mL chemo infusion (> 17m/m2)  150 mg/m2 (Treatment Plan Recorded) Intravenous Once FTruitt Merle MD 96 mL/hr at 06/15/21 1158 228 mg  at 06/15/21 1158   sodium chloride flush (NS) 0.9 % injection 10 mL  10 mL Intracatheter PRN Truitt Merle, MD        PHYSICAL EXAMINATION: ECOG PERFORMANCE STATUS: 3 - Symptomatic, >50% confined to bed  Vitals:   06/15/21 0850  BP: (!) 95/57  Pulse: (!) 102  Resp: 18  Temp: 98.3 F (36.8 C)  SpO2: 98%   Wt Readings from Last 3 Encounters:  06/15/21 108 lb 8 oz (49.2 kg)  05/24/21 116 lb 14.4 oz (53 kg)  05/19/21 115 lb 3.2 oz (52.3 kg)     GENERAL:alert, no distress and comfortable SKIN: skin color, texture, turgor are normal, no rashes or significant lesions EYES: normal, Conjunctiva are pink and non-injected, sclera clear Musculoskeletal: (+) b/l leg weakness NEURO: alert & oriented x 3 with fluent speech, no focal motor/sensory deficits  LABORATORY DATA:  I have reviewed the data as listed CBC Latest Ref Rng & Units  06/15/2021 05/24/2021 05/05/2021  WBC 4.0 - 10.5 K/uL 25.6(H) 15.3(H) 19.5(H)  Hemoglobin 12.0 - 15.0 g/dL 12.4 14.3 14.6  Hematocrit 36.0 - 46.0 % 37.2 41.5 43.2  Platelets 150 - 400 K/uL 305 140(L) 398     CMP Latest Ref Rng & Units 06/15/2021 05/24/2021 05/05/2021  Glucose 70 - 99 mg/dL 82 164(H) 125(H)  BUN 8 - 23 mg/dL 16 18 19   Creatinine 0.44 - 1.00 mg/dL 0.38(L) 0.60 0.80  Sodium 135 - 145 mmol/L 139 135 136  Potassium 3.5 - 5.1 mmol/L 3.3(L) 4.2 4.0  Chloride 98 - 111 mmol/L 103 99 102  CO2 22 - 32 mmol/L 31 28 23   Calcium 8.9 - 10.3 mg/dL 8.4(L) 8.4(L) 8.3(L)  Total Protein 6.5 - 8.1 g/dL 5.5(L) 5.8(L) 6.3(L)  Total Bilirubin 0.3 - 1.2 mg/dL 0.4 0.4 0.3  Alkaline Phos 38 - 126 U/L 140(H) 73 80  AST 15 - 41 U/L 14(L) 12(L) 13(L)  ALT 0 - 44 U/L 22 37 16      RADIOGRAPHIC STUDIES: I have personally reviewed the radiological images as listed and agreed with the findings in the report. No results found.    Orders Placed This Encounter  Procedures   Ambulatory Referral to [facility] Nutrition    Referral Priority:   Urgent    Referral Type:   Consultation    Referral Reason:   Specialty Services Required    Number of Visits Requested:   1   Ambulatory referral to Home Health    Referral Priority:   Routine    Referral Type:   Home Health Care    Referral Reason:   Specialty Services Required    Referred to Provider:   [facility]    Requested Specialty:   [place]    Number of Visits Requested:   1   All questions were answered. The Cynthia Gonzalez knows to call the clinic with any problems, questions or concerns. No barriers to learning was detected. The total time spent in the appointment was 30 minutes.     Truitt Merle, MD 06/15/2021   I, Wilburn Mylar, am acting as scribe for Truitt Merle, MD.   I have reviewed the above documentation for accuracy and completeness, and I agree with the above.

## 2021-06-15 NOTE — Progress Notes (Signed)
Received request to see patient today during infusion.  71 year old female diagnosed with metastatic lung cancer.  She is followed by Dr. Burr Medico.  She is receiving carbo/Taxol/Keytruda.  Past medical history includes hyperlipidemia.    Labs include potassium 3.3 and albumin 3.0.  Height: 5 feet 5 inches. Weight: 108.5 pounds on February 23. Usual body weight: 125-130 pounds per patient.  Patient weighed 121 pounds January 13. BMI: 18.06.  Patient reports that she is drinking 1 carton of oral nutrition supplement daily.  States she did not know she could drink more. She denies nausea and vomiting. Reports diarrhea resolved with Imodium. States her daughter often cooks for her and cooks fresh vegetables. Reports eating oatmeal and fruits with coffee and water for breakfast.  She sometimes also drinks Ensure.  Nutrition diagnosis:  Unintended weight loss related to metastatic lung cancer and associated treatments as evidenced by 10% weight loss in less than 2 months which is significant.  Intervention: Educated on importance of consuming small amounts of food often throughout the day.  Recommended she eat something with calories and protein every 2-3 hours. Encourage patient to eat 3 meals meals daily and try Ensure Plus or equivalent 3 times daily between meals.  Provided coupons. Educated on high-calorie, high-protein foods. Provided nutrition facts sheets.  Questions were answered.  Teach back method used.  Contact information given.  Monitoring, evaluation, goals: Patient will tolerate increased calories and protein to minimize further weight loss.  Next visit: Wednesday, March 15 during infusion with Vinnie Level.  **Disclaimer: This note was dictated with voice recognition software. Similar sounding words can inadvertently be transcribed and this note may contain transcription errors which may not have been corrected upon publication of note.**

## 2021-06-16 LAB — T4: T4, Total: 6 ug/dL (ref 4.5–12.0)

## 2021-06-17 ENCOUNTER — Inpatient Hospital Stay: Payer: Medicare Other

## 2021-06-21 ENCOUNTER — Telehealth: Payer: Self-pay

## 2021-06-21 NOTE — Telephone Encounter (Signed)
Received fax response from [facility] stating they cannot accept this pt (denied) for referral of Home Health with PT, and OT.  Faxed referral to [facility] at T [phone] and F [phone].  Fax confirmation confirmed.  Awaiting [facility]'s response. ?

## 2021-06-23 ENCOUNTER — Telehealth: Payer: Self-pay

## 2021-06-23 NOTE — Telephone Encounter (Signed)
Pt's Home Health referral was not accepted by [facility].  Sent referral to [facility] at [phone]) to see if they will accept the pt for [place].  Fax confirmation received. ?

## 2021-06-27 ENCOUNTER — Telehealth: Payer: Self-pay

## 2021-06-27 ENCOUNTER — Inpatient Hospital Stay: Payer: Medicare Other | Attending: Hematology | Admitting: Hematology

## 2021-06-27 ENCOUNTER — Encounter: Payer: Self-pay | Admitting: Hematology

## 2021-06-27 DIAGNOSIS — C3492 Malignant neoplasm of unspecified part of left bronchus or lung: Secondary | ICD-10-CM

## 2021-06-27 MED ORDER — ACETAMINOPHEN-CODEINE #3 300-30 MG PO TABS: 1.0000 | ORAL_TABLET | Freq: Four times a day (QID) | ORAL | 0 refills | Status: AC | PRN

## 2021-06-27 NOTE — Telephone Encounter (Signed)
Spoke with pt's daughter Threasa Beards regarding pt's plan of care.  Threasa Beards stated that the pt does not want to do chemotherapy anymore.  Threasa Beards said her mom asked last infusion when her last chemotherapy was going to be and was told she gets chemotherapy every 3 weeks.  Threasa Beards state they would like to know how much longer the pt will need to do chemotherapy.  What's the life expectancy for the pt with chemotherapy and stopping chemotherapy?  Threasa Beards stated her mom was a very independent person and to now be literally dependent on her children just to help her sit-up, change her adult diapers, cook for her, and etc.  The pt is extremely depressed about the situation.  Threasa Beards stated her mom was recently seen in the ED at [facility] due to difficulty breathing.  After several test performed, the pt was diagnosed as having a Panic Attack and was prescribed Ativan 0.5mg  total of 1mg .  Threasa Beards stated her mom has not needed the Ativan since her ER visit.  Threasa Beards is requesting if Dr. Burr Medico could call she and her mom this evening after 5pm to discuss the pt's next course of action.  Informed pt that I will notify Dr. Burr Medico of their concerns.   ?

## 2021-06-28 ENCOUNTER — Encounter: Payer: Self-pay | Admitting: Hematology

## 2021-06-28 ENCOUNTER — Other Ambulatory Visit: Payer: Self-pay

## 2021-06-28 DIAGNOSIS — C7931 Secondary malignant neoplasm of brain: Secondary | ICD-10-CM

## 2021-06-28 DIAGNOSIS — C3492 Malignant neoplasm of unspecified part of left bronchus or lung: Secondary | ICD-10-CM

## 2021-06-28 NOTE — Progress Notes (Signed)
[facility]   [phone] [phone]   Clinic Follow up Note   Patient Care Team: Nolene Ebbs, MD as PCP - General (Internal Medicine) Valrie Hart, RN as Oncology Nurse Navigator (Oncology) Truitt Merle, MD as Consulting Physician (Hematology) Tyler Pita, MD as Consulting Physician (Radiation Oncology) Garner [facility], DO as Consulting Physician (Pulmonary Disease)  Date of Service:  06/28/2021  CHIEF COMPLAINT: f/u of metastatic lung cancer  CURRENT THERAPY:  First-line carboplatin/paclitaxel with [facility]ard [facility], starting 05/24/21  ASSESSMENT & PLAN:  Cynthia Gonzalez is a 71 y.o. female with   1. Squamous Cell LUL Lung Cancer, with nodal and brain metastasis -presented to ED 04/26/21 with right hand and wrist swelling and weakness. She was also found to have left-sided facial dropping on physical exam. Head CT revealed a 1.7 cm left frontal lobe mass and a right parapharyngeal space mass. CT CAP showed a LUL mass, right hilar mass, mediastinal and hilar adenopathy, as well as nonspecific left adrenal and gastric cardia wall thickening.  -inpatient bronchoscopy 04/28/21 by Dr. Valeta Harms and biopsy of LUL lung mass and 11R node both confirmed non-small cell carcinoma, IHC consistent with squamous cell histology. -she started first-line chemotherapy, carboplatin and paclitaxel with Keytruda every 3 weeks, on 05/24/2021.  She tolerated first cycle of chemo overall well, but she has developed b/l leg weakness.  -Overall condition has significantly deteriorated lately, she is bedbound, needs total care by her family member, she is not a candidate for more chemotherapy. -We discussed palliative care and hospice, I explained to her and her daughter about the service in detail, she agrees to enroll to hospice.  I will make a urgent referral.  2. Weakness, immobility  -She has developed new onset leg weakness following first cycle chemo. Brain MRI 05/31/21  showed stable size of treated metastasis, decreased surrounding edema. -Her leg weakness has gotten much worse lately, she is bedbound, not able to walk  3. Oligo brian metastasis -she received one fraction of SRS on 05/08/21 under Dr. Tammi Klippel. -Dexamethasone tapering per Dr. Tammi Klippel, she finished tapering yesterday -surveillance brain MRI scheduled for 08/31/21   4. Goal of care discussion  -We again discussed the incurable nature of her cancer, and the overall poor prognosis, she unfortunately is not a candidate for more chemotherapy due to her poor performance status.  Her life expectancy is likely a few months.  I discussed with patient and her daughter today. -The patient understands the goal of care is palliative.    PLAN: -Due to her rapid decline of her overall condition, and immobility, she is not a candidate for therapy -I will refer her to [facility] [place], I will remain as her attending when she is under hospice care. -I called in Tylenol #3 for her abdominal cramps    No problem-specific Assessment & Plan notes found for this encounter.   SUMMARY OF ONCOLOGIC HISTORY: Oncology History Overview Note   Cancer Staging  Squamous cell lung cancer, left [facility]) Staging form: Lung, AJCC 8th Edition - Clinical stage from 04/28/2021: Stage IVB (cT2, cN3, cM1c) - Signed by Truitt Merle, MD on 05/06/2021     Brain metastasis ([place])  04/26/2021 Imaging   EXAM: CT HEAD WITHOUT CONTRAST  IMPRESSION: 1. Approximally 1.7 cm left frontal lobe mass with moderate surrounding vasogenic edema and minimal mass effect. May represent metastatic or primary lesion. MRI brain with contrast recommended as well as oncologic workup as indicated. 2. Partially visualized hypodense mass in  the right neck parapharyngeal space. Indeterminate but could also represent metastasis given the brain findings. Could also be evaluated further with CT neck with contrast.   04/27/2021 Imaging   EXAM: MRI HEAD  WITHOUT AND WITH CONTRAST  IMPRESSION: 1. 1.9 x 1.5 x 1.9 cm intraparenchymal mass involving the posterior left frontoparietal region with surrounding vasogenic edema. 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 2. No other acute intracranial process. 3. 2.1 x 2.3 cm heterogeneous mass within the right parapharyngeal space, better evaluated on prior neck CT.   04/27/2021 Initial Diagnosis   Brain metastasis ([facility])   Squamous cell lung cancer, left ([place])  04/26/2021 Imaging   EXAM: CT NECK WITH CONTRAST  IMPRESSION: 1. Heterogeneously enhancing, centrally hypodense mass in the right parapharyngeal space, favored to represent an enlarged, necrotic right level 2A lymph node, concerning for metastatic disease. Biopsy is recommended. 2. No other acute finding in the neck.   04/26/2021 Imaging   EXAM: CT CHEST, ABDOMEN, AND PELVIS WITH CONTRAST  IMPRESSION: 1. Large spiculated left upper lobe mass consistent with primary lung cancer. 2. Right hilar mass versus adenopathy, with partial obstruction of segmental right upper lobe bronchi. 3. Mediastinal and hilar adenopathy consistent with nodal metastases. 4. Nonspecific left adrenal thickening, metastatic disease not excluded. 5. Nonspecific wall thickening of the gastric cardia. 6. 3.7 cm infrarenal abdominal aortic aneurysm. Recommend follow-up every 2 years. Reference: J Am Coll Radiol 9892;11:941-740. 7. 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 The bilateral profundus femoral arteries remain patent. 8. Cholelithiasis without cholecystitis. 9.  Aortic Atherosclerosis (ICD10-I70.0).   04/28/2021 Pathology Results   FINAL MICROSCOPIC DIAGNOSIS:  A. LUNG, LUL, BRUSHING:  - Malignant cells consistent with non-small cell carcinoma   B. LUNG, LUL, FINE NEEDLE ASPIRATION:  - Malignant cells consistent with non-small cell carcinoma   C. LYMPH NODE,  11R, FINE NEEDLE ASPIRATION:  - Malignant cells consistent with non-small cell carcinoma   ADDENDUM:  The malignant cells are positive with cytokeratin 5/6 and p63 and negative with TTF-1 consistent with squamous cell carcinoma.    04/28/2021 Cancer Staging   Staging form: Lung, AJCC 8th Edition - Clinical stage from 04/28/2021: Stage IVB (cT2, cN3, cM1c) - Signed by Truitt Merle, MD on 05/06/2021    05/05/2021 Initial Diagnosis   Squamous cell lung cancer, left ([place])   05/18/2021 PET scan   IMPRESSION: 1. Hypermetabolic rounded mass in the LEFT upper lobe most consistent with bronchogenic carcinoma. 2. Contralateral hypermetabolic paratracheal lymph node and RIGHT hilar lymph node metastasis. 3. No evidence of metastatic disease outside the thorax.   05/24/2021 -  Chemotherapy   Patient is on Treatment Plan : LUNG NSCLC Carboplatin (6) + Paclitaxel (200) + Pembrolizumab (200) D1 q21d x 4 cycles / Pembrolizumab (200) Maintenance D1 q21d        INTERVAL HISTORY:  JULEEN SORRELS is scheduled for a phone visit due to her concern of weakness.  Her overall condition has deteriorated further lately, she is totally bedbound, not able to walk by herself.  She depends on her family member to change diaper, and bathe her.  Her appetite is moderate, she is drinking Ensure 3 times a day, and eating small meals.  She has intermittent abdominal cramps, no significant nausea.  Bowel movement has been normal.  She finished dexamethasone taper yesterday.   All other systems were reviewed with the patient and are negative.  MEDICAL HISTORY:  Past Medical History:  Diagnosis Date   Allergy    Blood transfusion without reported diagnosis 1976   Hyperlipidemia     SURGICAL HISTORY: Past Surgical History:  Procedure Laterality Date   BRONCHIAL BIOPSY  04/28/2021   Procedure: BRONCHIAL BIOPSIES;  Surgeon: Gar[facility], DO;  Location: [facility];  Service: Pulmonary;;   BRONCHIAL BRUSHINGS  04/28/2021    Procedure: BRONCHIAL BRUSHINGS;  Surgeon: Garner [facility], DO;  Location: [facility];  Service: Pulmonary;;   BRONCHIAL NEEDLE ASPIRATION BIOPSY  04/28/2021   Procedure: BRONCHIAL NEEDLE ASPIRATION BIOPSIES;  Surgeon: Gar[facility], DO;  Location: [facility];  Service: Pulmonary;;   DENTAL SURGERY     with sedative per pt, not sedated   IR IMAGING GUIDED PORT INSERTION  05/22/2021   TUBAL LIGATION  1983   VAGINAL DELIVERY     x7-sedation with 1 delivery [place] N/A 04/28/2021   Procedure: VIDEO BRONCHOSCOPY WITH ENDOBRONCHIAL ULTRASOUND;  Surgeon: Gar[facility], DO;  Location: [facility];  Service: Pulmonary;  Laterality: N/A;   VIDEO BRONCHOSCOPY WITH RADIAL ENDOBRONCHIAL ULTRASOUND  04/28/2021   Procedure: VIDEO BRONCHOSCOPY WITH RADIAL ENDOBRONCHIAL ULTRASOUND;  Surgeon: Gar[facility], DO;  Location: [facility];  Service: Pulmonary;;    I have reviewed the social history and family history with the patient and they are unchanged from previous note.  ALLERGIES:  has No Known Allergies.  MEDICATIONS:  Current Outpatient Medications  Medication Sig Dispense Refill   acetaminophen-codeine (TYLENOL #3) 300-30 MG tablet Take 1 tablet by mouth every 6 (six) hours as needed for moderate pain. 30 tablet 0   atorvastatin (LIPITOR) 40 MG tablet Take 1 tablet (40 mg total) by mouth daily. 30 tablet 2   guaiFENesin-dextromethorphan (ROBITUSSIN DM) 100-10 MG/5ML syrup Take 10 mLs by mouth every 4 (four) hours as needed for cough. 118 mL 0   lidocaine-prilocaine (EMLA) cream Apply to affected area once 30 g 3   nystatin (MYCOSTATIN) 100000 UNIT/ML suspension Take 5 mLs (500,000 Units total) by mouth 4 (four) times daily. (Patient not taking: Reported on 06/07/2021) 473 mL 0   ondansetron (ZOFRAN) 8 MG tablet Take 1 tablet (8 mg total) by mouth 2 (two) times daily as needed for refractory nausea / vomiting. Start on day 3 after carboplatin  chemo. 30 tablet 1   pantoprazole (PROTONIX) 40 MG tablet Take 1 tablet (40 mg total) by mouth daily. 30 tablet 0   potassium chloride SA (KLOR-CON M) 20 MEQ tablet Take 1 tablet (20 mEq total) by mouth daily. 10 tablet 0   prochlorperazine (COMPAZINE) 10 MG tablet Take 1 tablet (10 mg total) by mouth every 6 (six) hours as needed (Nausea or vomiting). 30 tablet 1   No current facility-administered medications for this visit.    PHYSICAL EXAMINATION: ECOG PERFORMANCE STATUS: 3 - Symptomatic, >50% confined to bed  There were no vitals filed for this visit.  Wt Readings from Last 3 Encounters:  06/15/21 108 lb 8 oz (49.2 kg)  05/24/21 116 lb 14.4 oz (53 kg)  05/19/21 115 lb 3.2 oz (52.3 kg)    No exam today   LABORATORY DATA:  I have reviewed the data as listed CBC Latest Ref Rng & Units 06/15/2021 05/24/2021 05/05/2021  WBC 4.0 - 10.5 K/uL 25.6(H) 15.3(H) 19.5(H)  Hemoglobin 12.0 - 15.0 g/dL 12.4 14.3 14.6  Hematocrit 36.0 - 46.0 % 37.2 41.5 43.2  Platelets 150 - 400 K/uL 305 140(L) 398     CMP  Latest Ref Rng & Units 06/15/2021 05/24/2021 05/05/2021  Glucose 70 - 99 mg/dL 82 164(H) 125(H)  BUN 8 - 23 mg/dL 16 18 19   Creatinine 0.44 - 1.00 mg/dL 0.38(L) 0.60 0.80  Sodium 135 - 145 mmol/L 139 135 136  Potassium 3.5 - 5.1 mmol/L 3.3(L) 4.2 4.0  Chloride 98 - 111 mmol/L 103 99 102  CO2 22 - 32 mmol/L 31 28 23   Calcium 8.9 - 10.3 mg/dL 8.4(L) 8.4(L) 8.3(L)  Total Protein 6.5 - 8.1 g/dL 5.5(L) 5.8(L) 6.3(L)  Total Bilirubin 0.3 - 1.2 mg/dL 0.4 0.4 0.3  Alkaline Phos 38 - 126 U/L 140(H) 73 80  AST 15 - 41 U/L 14(L) 12(L) 13(L)  ALT 0 - 44 U/L 22 37 16      RADIOGRAPHIC STUDIES: I have personally reviewed the radiological images as listed and agreed with the findings in the report. No results found.    No orders of the defined types were placed in this encounter.  All questions were answered. The patient knows to call the clinic with any problems, questions or concerns. No  barriers to learning was detected. The total time spent in the appointment was 30 minutes.     Truitt Merle, MD 06/27/2021

## 2021-06-28 NOTE — Progress Notes (Signed)
Hospice referral placed with [facility] [phone]  F [phone]).  Fax confirmation received. ?

## 2021-07-05 ENCOUNTER — Inpatient Hospital Stay: Payer: Medicare Other | Admitting: Dietician

## 2021-07-05 ENCOUNTER — Inpatient Hospital Stay: Payer: Medicare Other | Admitting: Nurse Practitioner

## 2021-07-05 ENCOUNTER — Inpatient Hospital Stay: Payer: Medicare Other

## 2021-07-06 ENCOUNTER — Encounter: Payer: Self-pay | Admitting: Hematology

## 2021-07-08 ENCOUNTER — Ambulatory Visit: Payer: Medicare Other

## 2021-08-31 ENCOUNTER — Ambulatory Visit (HOSPITAL_COMMUNITY): Admission: RE | Admit: 2021-08-31 | Payer: Medicare Other | Source: Ambulatory Visit

## 2021-09-04 ENCOUNTER — Inpatient Hospital Stay: Payer: Medicare Other | Attending: Hematology

## 2021-09-04 ENCOUNTER — Other Ambulatory Visit: Payer: Self-pay | Admitting: Hematology

## 2021-09-06 ENCOUNTER — Ambulatory Visit: Payer: Medicare Other | Admitting: Urology

## 2021-10-16 ENCOUNTER — Encounter: Payer: Self-pay | Admitting: *Deleted

## 2021-11-16 ENCOUNTER — Ambulatory Visit: Payer: Medicare Other | Admitting: Nurse Practitioner

## 2021-11-16 ENCOUNTER — Encounter: Payer: Self-pay | Admitting: Hematology

## 2022-02-12 ENCOUNTER — Telehealth: Payer: Self-pay | Admitting: *Deleted

## 2022-02-21 NOTE — Telephone Encounter (Signed)
Received call from [facility] that pt died this am @ 8:31.  Message to Dr Burr Medico & [facility].

## 2022-02-21 DEATH — deceased
